# Patient Record
Sex: Female | Born: 1960 | Race: White | Hispanic: Yes | Marital: Single | Smoking: Never smoker
Health system: Southern US, Community
[De-identification: ages and names within clinical notes are randomized; demographics above are authoritative.]

## PROBLEM LIST (undated history)

## (undated) DIAGNOSIS — A879 Viral meningitis, unspecified: Secondary | ICD-10-CM

## (undated) DIAGNOSIS — Z9889 Other specified postprocedural states: Secondary | ICD-10-CM

## (undated) DIAGNOSIS — F329 Major depressive disorder, single episode, unspecified: Secondary | ICD-10-CM

## (undated) DIAGNOSIS — Z8659 Personal history of other mental and behavioral disorders: Secondary | ICD-10-CM

## (undated) DIAGNOSIS — R112 Nausea with vomiting, unspecified: Secondary | ICD-10-CM

## (undated) DIAGNOSIS — M1990 Unspecified osteoarthritis, unspecified site: Secondary | ICD-10-CM

## (undated) DIAGNOSIS — K219 Gastro-esophageal reflux disease without esophagitis: Secondary | ICD-10-CM

## (undated) DIAGNOSIS — G971 Other reaction to spinal and lumbar puncture: Secondary | ICD-10-CM

## (undated) DIAGNOSIS — G4733 Obstructive sleep apnea (adult) (pediatric): Secondary | ICD-10-CM

## (undated) DIAGNOSIS — I10 Essential (primary) hypertension: Secondary | ICD-10-CM

## (undated) DIAGNOSIS — Z9989 Dependence on other enabling machines and devices: Secondary | ICD-10-CM

## (undated) DIAGNOSIS — H269 Unspecified cataract: Secondary | ICD-10-CM

## (undated) DIAGNOSIS — C4490 Unspecified malignant neoplasm of skin, unspecified: Secondary | ICD-10-CM

## (undated) DIAGNOSIS — M353 Polymyalgia rheumatica: Secondary | ICD-10-CM

## (undated) DIAGNOSIS — F32A Depression, unspecified: Secondary | ICD-10-CM

## (undated) DIAGNOSIS — I82409 Acute embolism and thrombosis of unspecified deep veins of unspecified lower extremity: Secondary | ICD-10-CM

## (undated) DIAGNOSIS — E236 Other disorders of pituitary gland: Secondary | ICD-10-CM

## (undated) HISTORY — DX: Essential (primary) hypertension: I10

## (undated) HISTORY — PX: COLONOSCOPY: SHX174

## (undated) HISTORY — DX: Acute embolism and thrombosis of unspecified deep veins of unspecified lower extremity: I82.409

## (undated) HISTORY — DX: Major depressive disorder, single episode, unspecified: F32.9

## (undated) HISTORY — DX: Personal history of other mental and behavioral disorders: Z86.59

## (undated) HISTORY — DX: Unspecified malignant neoplasm of skin, unspecified: C44.90

## (undated) HISTORY — DX: Depression, unspecified: F32.A

## (undated) HISTORY — DX: Viral meningitis, unspecified: A87.9

## (undated) HISTORY — DX: Unspecified osteoarthritis, unspecified site: M19.90

## (undated) HISTORY — DX: Dependence on other enabling machines and devices: Z99.89

## (undated) HISTORY — DX: Morbid (severe) obesity due to excess calories: E66.01

## (undated) HISTORY — DX: Obstructive sleep apnea (adult) (pediatric): G47.33

---

## 1986-03-09 DIAGNOSIS — Z8659 Personal history of other mental and behavioral disorders: Secondary | ICD-10-CM

## 1986-03-09 HISTORY — DX: Personal history of other mental and behavioral disorders: Z86.59

## 1995-03-10 DIAGNOSIS — A879 Viral meningitis, unspecified: Secondary | ICD-10-CM

## 1995-03-10 HISTORY — DX: Viral meningitis, unspecified: A87.9

## 2001-03-09 HISTORY — PX: LAPAROSCOPIC CHOLECYSTECTOMY: SUR755

## 2004-03-09 DIAGNOSIS — I82409 Acute embolism and thrombosis of unspecified deep veins of unspecified lower extremity: Secondary | ICD-10-CM

## 2004-03-09 HISTORY — DX: Acute embolism and thrombosis of unspecified deep veins of unspecified lower extremity: I82.409

## 2004-05-02 ENCOUNTER — Ambulatory Visit: Payer: Self-pay | Admitting: Cardiology

## 2005-03-09 HISTORY — PX: BUNIONECTOMY: SHX129

## 2005-03-09 HISTORY — PX: MYOMECTOMY: SHX85

## 2015-07-22 ENCOUNTER — Encounter: Payer: Self-pay | Admitting: Neurology

## 2015-07-22 ENCOUNTER — Ambulatory Visit (INDEPENDENT_AMBULATORY_CARE_PROVIDER_SITE_OTHER): Payer: PRIVATE HEALTH INSURANCE | Admitting: Neurology

## 2015-07-22 VITALS — BP 132/78 | HR 68 | Ht 64.5 in | Wt 290.0 lb

## 2015-07-22 DIAGNOSIS — R269 Unspecified abnormalities of gait and mobility: Secondary | ICD-10-CM | POA: Diagnosis not present

## 2015-07-22 DIAGNOSIS — M2550 Pain in unspecified joint: Secondary | ICD-10-CM | POA: Diagnosis not present

## 2015-07-22 NOTE — Progress Notes (Signed)
Reason for visit: Gait disorder  Referring physician: Dr. Hennie Molina is a 55 y.o. female  History of present illness:  [name] is a 55 year old right-handed white female with a history of onset of a gait problem that began about 2 months ago. The patient indicated that the problem started with diffuse discomfort in the joints mainly affecting the hands, right elbow and shoulder, the feet and ankles. The patient was given a shot of Depo-Medrol which made the joint discomfort much better. The patient has had ongoing issues with the sensation of weakness in the legs, however. She has not had any falls, but she reports some difficulty getting up from a chair or climbing stairs. She has had to walk with her feet wider apart. She denies any significant neck or low back pain, but she does have some mid back pain. She has not had any difficulty controlling the bowels or the bladder with the exception that she did have some incontinence of the bladder last evening. The patient reports no fevers, chills, or night sweats. She has not had any skin rashes. She indicates that her primary doctor did check blood work for lupus and rheumatoid arthritis, the studies were unremarkable. She indicates that a sedimentation rate was done and was normal. I do not have the results of these tests. The patient indicates that in 2006 she underwent a MRI of the brain because of some vision changes affecting the right eye. The MRI was reviewed on this visit, this shows minimal white matter changes. No contrast enhancement was seen. The patient was seen by Dr. Sanda Klein at that time. No diagnosis was rendered. She is sent to this office for an evaluation. MRI of the cervical spine done in July 2011 shows evidence of a disc protrusion at the C6-7 level off to the right that did not compress the spinal cord. The patient was having left arm pain at that time.  Past Medical History  Diagnosis Date  . Depression   . H/O  major depression 1988  . DVT (deep venous thrombosis) ([place]) 2006  . Viral meningitis 1997  . Osteoarthritis   . Hypertension   . Skin cancer   . Morbidly obese ([place])   . OSA on CPAP     Past Surgical History  Procedure Laterality Date  . Laparoscopic cholecystectomy  2003  . Bunionectomy  2007  . Myomectomy  2007    Family History  Problem Relation Age of Onset  . Hypertension Mother   . Osteoporosis Mother   . Dementia Paternal Grandmother   . Aortic aneurysm Paternal Grandfather   . Heart attack Maternal Grandfather     Social history:  reports that she has never smoked. She does not have any smokeless tobacco history on file. She reports that she drinks alcohol. She reports that she does not use illicit drugs.  Medications:  Prior to Admission medications   Medication Sig Start Date End Date Taking? Authorizing Provider  aspirin 81 MG tablet Take 81 mg by mouth daily.   Yes Historical Provider, MD  Calcium Carb-Cholecalciferol (CALCIUM 600 + D PO) Take 2 tablets by mouth daily.   Yes Historical Provider, MD  losartan (COZAAR) 100 MG tablet Take 100 mg by mouth daily.   Yes Historical Provider, MD  meloxicam (MOBIC) 15 MG tablet Take 15 mg by mouth daily.   Yes Historical Provider, MD  Multiple Vitamin (MULTIVITAMIN) tablet Take 1 tablet by mouth daily.   Yes  Historical Provider, MD  omeprazole (PRILOSEC) 20 MG capsule Take 20 mg by mouth daily.   Yes Historical Provider, MD     No Known Allergies  ROS:  Out of a complete 14 system review of symptoms, the patient complains only of the following symptoms, and all other reviewed systems are negative.  Hearing loss Itching Diarrhea Urinary incontinence Feeling hot, cold Joint pain Weakness Too much sleep, decreased energy Sleepiness  Blood pressure 132/78, pulse 68, height 5' 4.5" (1.638 m), weight 290 lb (131.543 kg).  Physical Exam  General: The patient is alert and cooperative at the time of the  examination. The patient is morbidly obese.  Eyes: Pupils are equal, round, and reactive to light. Discs are flat bilaterally.  Neck: The neck is supple, no carotid bruits are noted.  Respiratory: The respiratory examination is clear.  Cardiovascular: The cardiovascular examination reveals a regular rate and rhythm, no obvious murmurs or rubs are noted.  Skin: Extremities are without significant edema.  Neurologic Exam  Mental status: The patient is alert and oriented x 3 at the time of the examination. The patient has apparent normal recent and remote memory, with an apparently normal attention span and concentration ability.  Cranial nerves: Facial symmetry is present. There is good sensation of the face to pinprick and soft touch bilaterally. The strength of the facial muscles and the muscles to head turning and shoulder shrug are normal bilaterally. Speech is well enunciated, no aphasia or dysarthria is noted. Extraocular movements are full. Visual fields are full. The tongue is midline, and the patient has symmetric elevation of the soft palate. No obvious hearing deficits are noted.  Motor: The motor testing reveals 5 over 5 strength of all 4 extremities. Good symmetric motor tone is noted throughout.  Sensory: Sensory testing is intact to pinprick, soft touch, vibration sensation, and position sense on all 4 extremities. No evidence of extinction is noted.  Coordination: Cerebellar testing reveals good finger-nose-finger and heel-to-shin bilaterally.  Gait and station: Gait is associated with a wide-based gait. The patient is able to arise from a seated position with arms crossed. Tandem gait is unsteady. Romberg is negative. No drift is seen.  Reflexes: Deep tendon reflexes are symmetric and normal bilaterally. Toes are downgoing bilaterally.   Assessment/Plan:  1. Mild lower extremity weakness  2. Arthralgias  The patient indicates a rather rapid onset of joint pains and  some sensation of weakness in the legs. The patient has had some blood work showing a negative rheumatoid factor and an ANA. The patient will undergo further blood work today, and she will undergo nerve conduction studies of both legs, and EMG of both legs. Further evaluation may be necessary depending upon the results of the above.  Jill Alexanders MD 07/22/2015 6:55 PM  [facility] [address] [place], [place] [address]  Phone [phone] Fax [phone]

## 2015-07-23 LAB — ANGIOTENSIN CONVERTING ENZYME: ANGIO CONVERT ENZYME: 40 U/L (ref 14–82)

## 2015-07-23 LAB — B. BURGDORFI ANTIBODIES

## 2015-07-23 LAB — SJOGREN'S SYNDROME ANTIBODS(SSA + SSB)

## 2015-07-23 LAB — C-REACTIVE PROTEIN: CRP: 2.3 mg/L (ref 0.0–4.9)

## 2015-07-23 LAB — VITAMIN B12: VITAMIN B 12: 622 pg/mL (ref 211–946)

## 2015-07-23 LAB — CK: CK TOTAL: 199 U/L — AB (ref 24–173)

## 2015-07-25 ENCOUNTER — Telehealth: Payer: Self-pay | Admitting: *Deleted

## 2015-07-25 NOTE — Telephone Encounter (Signed)
------------------------------------------------------------   Laurie Molina                CID [id number]  Patient SAME                 Pt's Dr              Area Code 336 Phone# [phone] 6/21 12:45PM APPT          ALT# [id number]                                    Disp:Y/N N If Y = C/B If No Response In 29minutes ============================================================

## 2015-07-31 ENCOUNTER — Telehealth: Payer: Self-pay | Admitting: *Deleted

## 2015-07-31 NOTE — Telephone Encounter (Signed)
Pt has NCV with EMG scheduled 08/28/15.

## 2015-07-31 NOTE — Telephone Encounter (Signed)
Message For: [facility]                  Taken 24-MAY-17 at  1:17PM by RMS ------------------------------------------------------------ Laurie Molina                CID [id number]  Patient SAME**PLS CB 5/24**  Pt's Dr Jannifer Franklin       Area Code 336 Phone# [phone] ON 6/21-WAITING FOR         UPDATE TO SEE IF SHE CAN HAVE THEM DONE SEPARATE     Disp:Y/N N If Y = C/B If No Response In 12minutes ============================================================

## 2015-08-14 ENCOUNTER — Ambulatory Visit (INDEPENDENT_AMBULATORY_CARE_PROVIDER_SITE_OTHER): Payer: Self-pay | Admitting: Neurology

## 2015-08-14 ENCOUNTER — Encounter: Payer: Self-pay | Admitting: Neurology

## 2015-08-14 ENCOUNTER — Ambulatory Visit (INDEPENDENT_AMBULATORY_CARE_PROVIDER_SITE_OTHER): Payer: PRIVATE HEALTH INSURANCE | Admitting: Neurology

## 2015-08-14 DIAGNOSIS — R269 Unspecified abnormalities of gait and mobility: Secondary | ICD-10-CM

## 2015-08-14 DIAGNOSIS — M2550 Pain in unspecified joint: Secondary | ICD-10-CM

## 2015-08-14 DIAGNOSIS — R531 Weakness: Secondary | ICD-10-CM

## 2015-08-14 DIAGNOSIS — R2689 Other abnormalities of gait and mobility: Secondary | ICD-10-CM

## 2015-08-14 NOTE — Progress Notes (Signed)
Laurie Molina is a 55 year old patient with a history of rapidly progressive symptoms with poly-arthralgias, some sensation of weakness in the legs, difficulty walking. The patient returns for EMG nerve conduction studies today. The study has been relatively unremarkable, no evidence of a neuropathy or evidence of a myopathic disorder is seen. The patient has been seen by Dr. Trudie Reed and the possibility of a polymyalgia rheumatica syndrome has been considered even though sedimentation rates have been unremarkable.  The patient may be given a trial on steroids through Dr. Trudie Reed. If this is not effective, the patient may return for further evaluation through this office.

## 2015-08-14 NOTE — Procedures (Signed)
     HISTORY:  Laurie Molina is a 55 year old patient with a history of polyarthralgias, some sensation of lower extremity weakness. The patient has had blood work that has been relatively unremarkable with exception of minimal elevation in CK enzyme levels. She is being evaluated for the leg weakness.  NERVE CONDUCTION STUDIES:  Nerve conduction studies were performed on both lower extremities. The distal motor latencies and motor amplitudes for the peroneal and posterior tibial nerves were within normal limits. The nerve conduction velocities for these nerves were also normal. The H reflex latencies were normal. The sensory latencies for the peroneal nerves were within normal limits.   EMG STUDIES:  EMG study was performed on the right lower extremity:  The tibialis anterior muscle reveals 2 to 4K motor units with full recruitment. No fibrillations or positive waves were seen. The peroneus tertius muscle reveals 2 to 4K motor units with full recruitment. No fibrillations or positive waves were seen. The medial gastrocnemius muscle reveals 1 to 3K motor units with full recruitment. No fibrillations or positive waves were seen. The vastus lateralis muscle reveals 2 to 4K motor units with full recruitment. No fibrillations or positive waves were seen. The iliopsoas muscle reveals 2 to 4K motor units with full recruitment. No fibrillations or positive waves were seen. The biceps femoris muscle (long head) reveals 2 to 4K motor units with full recruitment. No fibrillations or positive waves were seen. The lumbosacral paraspinal muscles were tested at 3 levels, and revealed no abnormalities of insertional activity at all 3 levels tested. There was good relaxation.  EMG study was performed on the left lower extremity:  The tibialis anterior muscle reveals 2 to 4K motor units with full recruitment. No fibrillations or positive waves were seen. The peroneus tertius muscle reveals 2 to 4K motor  units with full recruitment. No fibrillations or positive waves were seen. The medial gastrocnemius muscle reveals 1 to 3K motor units with full recruitment. No fibrillations or positive waves were seen. The vastus lateralis muscle reveals 2 to 4K motor units with full recruitment. No fibrillations or positive waves were seen. The iliopsoas muscle reveals 2 to 4K motor units with full recruitment. No fibrillations or positive waves were seen. The biceps femoris muscle (long head) reveals 2 to 4K motor units with full recruitment. No fibrillations or positive waves were seen. The lumbosacral paraspinal muscles were tested at 3 levels, and revealed no abnormalities of insertional activity at all 3 levels tested. There was good relaxation.   IMPRESSION:  Nerve conduction studies performed on both lower extremities were within normal limits. No evidence of a peripheral neuropathy is seen. EMG evaluation of both lower extremities were unremarkable, without evidence of a myopathic or a neuropathic disorder. There is no evidence of a lumbosacral radiculopathy on either side.  Jill Alexanders MD 08/14/2015 4:07 PM  [facility] [address] [place], [place] [address]  Phone [phone] Fax [phone]

## 2015-08-14 NOTE — Progress Notes (Signed)
Please refer to EMG and nerve conduction study procedure note. 

## 2015-08-28 ENCOUNTER — Encounter: Payer: PPO | Admitting: Neurology

## 2015-12-16 ENCOUNTER — Other Ambulatory Visit: Payer: Self-pay | Admitting: Neurosurgery

## 2015-12-17 ENCOUNTER — Encounter (HOSPITAL_COMMUNITY): Payer: Self-pay | Admitting: *Deleted

## 2015-12-17 MED ORDER — DEXTROSE 5 % IV SOLN
3.0000 g | INTRAVENOUS | Status: DC
  Filled 2015-12-17: qty 3000

## 2015-12-17 NOTE — Progress Notes (Signed)
Pt denies SOB, chest pain, and being under the care of a cardiologist. Pt denies having a stress test, echo and cardiac cath. Pt denies having any recent labs but had a chest x ray and EKG at [facility]. records requested. Pt stated that she was advised by MD to stop taking Aspirin, last dose was yesterday. Pt made aware to stop taking vitamins, fish oil and herbal medications. Do not take any NSAIDs ie: Ibuprofen, Advil, Naproxen, BC and Goody Powder or any medication containing Aspirin. Pt verbalized understanding of all pre-op instructions.

## 2015-12-18 ENCOUNTER — Inpatient Hospital Stay (HOSPITAL_COMMUNITY)
Admission: EM | Admit: 2015-12-18 | Discharge: 2015-12-27 | DRG: 614 | Disposition: A | Discharge status: Home / Self Care | Payer: PRIVATE HEALTH INSURANCE | Attending: Neurosurgery | Admitting: Neurosurgery

## 2015-12-18 ENCOUNTER — Emergency Department (HOSPITAL_COMMUNITY): Payer: PRIVATE HEALTH INSURANCE

## 2015-12-18 ENCOUNTER — Inpatient Hospital Stay (HOSPITAL_COMMUNITY): Payer: PRIVATE HEALTH INSURANCE | Admitting: Anesthesiology

## 2015-12-18 ENCOUNTER — Inpatient Hospital Stay (HOSPITAL_COMMUNITY): Payer: PRIVATE HEALTH INSURANCE

## 2015-12-18 ENCOUNTER — Inpatient Hospital Stay (HOSPITAL_COMMUNITY): Admission: RE | Admit: 2015-12-18 | Payer: PRIVATE HEALTH INSURANCE | Source: Ambulatory Visit | Admitting: Neurosurgery

## 2015-12-18 ENCOUNTER — Encounter (HOSPITAL_COMMUNITY): Payer: Self-pay | Admitting: *Deleted

## 2015-12-18 ENCOUNTER — Encounter (HOSPITAL_COMMUNITY)
Admission: EM | Disposition: A | Discharge status: Home / Self Care | Payer: Self-pay | Source: Home / Self Care | Attending: Neurosurgery

## 2015-12-18 DIAGNOSIS — K219 Gastro-esophageal reflux disease without esophagitis: Secondary | ICD-10-CM | POA: Diagnosis present

## 2015-12-18 DIAGNOSIS — Z86718 Personal history of other venous thrombosis and embolism: Secondary | ICD-10-CM | POA: Diagnosis not present

## 2015-12-18 DIAGNOSIS — Z7982 Long term (current) use of aspirin: Secondary | ICD-10-CM | POA: Diagnosis not present

## 2015-12-18 DIAGNOSIS — H5347 Heteronymous bilateral field defects: Secondary | ICD-10-CM | POA: Diagnosis present

## 2015-12-18 DIAGNOSIS — I10 Essential (primary) hypertension: Secondary | ICD-10-CM | POA: Diagnosis present

## 2015-12-18 DIAGNOSIS — R519 Headache, unspecified: Secondary | ICD-10-CM

## 2015-12-18 DIAGNOSIS — D352 Benign neoplasm of pituitary gland: Principal | ICD-10-CM | POA: Diagnosis present

## 2015-12-18 DIAGNOSIS — Z7952 Long term (current) use of systemic steroids: Secondary | ICD-10-CM

## 2015-12-18 DIAGNOSIS — Z8249 Family history of ischemic heart disease and other diseases of the circulatory system: Secondary | ICD-10-CM

## 2015-12-18 DIAGNOSIS — Z85828 Personal history of other malignant neoplasm of skin: Secondary | ICD-10-CM

## 2015-12-18 DIAGNOSIS — Z79899 Other long term (current) drug therapy: Secondary | ICD-10-CM

## 2015-12-18 DIAGNOSIS — R51 Headache: Secondary | ICD-10-CM

## 2015-12-18 DIAGNOSIS — E236 Other disorders of pituitary gland: Secondary | ICD-10-CM | POA: Diagnosis present

## 2015-12-18 DIAGNOSIS — Z452 Encounter for adjustment and management of vascular access device: Secondary | ICD-10-CM

## 2015-12-18 DIAGNOSIS — R001 Bradycardia, unspecified: Secondary | ICD-10-CM | POA: Diagnosis present

## 2015-12-18 DIAGNOSIS — G4733 Obstructive sleep apnea (adult) (pediatric): Secondary | ICD-10-CM | POA: Diagnosis present

## 2015-12-18 DIAGNOSIS — Z6841 Body Mass Index (BMI) 40.0 and over, adult: Secondary | ICD-10-CM

## 2015-12-18 DIAGNOSIS — H814 Vertigo of central origin: Secondary | ICD-10-CM

## 2015-12-18 HISTORY — DX: Nausea with vomiting, unspecified: R11.2

## 2015-12-18 HISTORY — DX: Gastro-esophageal reflux disease without esophagitis: K21.9

## 2015-12-18 HISTORY — DX: Other specified postprocedural states: Z98.890

## 2015-12-18 HISTORY — DX: Other disorders of pituitary gland: E23.6

## 2015-12-18 HISTORY — DX: Unspecified cataract: H26.9

## 2015-12-18 HISTORY — DX: Other reaction to spinal and lumbar puncture: G97.1

## 2015-12-18 HISTORY — PX: SINUS ENDO W/FUSION: SHX777

## 2015-12-18 HISTORY — DX: Polymyalgia rheumatica: M35.3

## 2015-12-18 HISTORY — PX: CRANIOTOMY: SHX93

## 2015-12-18 LAB — BASIC METABOLIC PANEL
Anion gap: 10 (ref 5–15)
BUN: 25 mg/dL — ABNORMAL HIGH (ref 6–20)
CO2: 27 mmol/L (ref 22–32)
Calcium: 9.3 mg/dL (ref 8.9–10.3)
Chloride: 104 mmol/L (ref 101–111)
Creatinine, Ser: 0.76 mg/dL (ref 0.44–1.00)
GFR calc Af Amer: >60 mL/min (ref >60)
GFR calc non Af Amer: >60 mL/min (ref >60)
Glucose, Bld: 148 mg/dL — ABNORMAL HIGH (ref 65–99)
Potassium: 4.2 mmol/L (ref 3.5–5.1)
Sodium: 141 mmol/L (ref 135–145)

## 2015-12-18 LAB — TYPE AND SCREEN
ABO/RH(D): A POS
Antibody Screen: NEGATIVE

## 2015-12-18 LAB — PROTIME-INR
INR: 0.88
Prothrombin Time: 11.9 seconds (ref 11.4–15.2)

## 2015-12-18 LAB — CBC
HCT: 41 % (ref 36.0–46.0)
Hemoglobin: 13.5 g/dL (ref 12.0–15.0)
MCH: 30.8 pg (ref 26.0–34.0)
MCHC: 32.9 g/dL (ref 30.0–36.0)
MCV: 93.6 fL (ref 78.0–100.0)
Platelets: 153 10*3/uL (ref 150–400)
RBC: 4.38 MIL/uL (ref 3.87–5.11)
RDW: 14.4 % (ref 11.5–15.5)
WBC: 6.2 10*3/uL (ref 4.0–10.5)

## 2015-12-18 LAB — ABO/RH: ABO/RH(D): A POS

## 2015-12-18 SURGERY — CRANIOTOMY HYPOPHYSECTOMY TRANSNASAL APPROACH
Anesthesia: General | Site: Nose

## 2015-12-18 MED ORDER — ONDANSETRON HCL 4 MG PO TABS: 4.0000 mg | ORAL_TABLET | ORAL | Status: DC | PRN

## 2015-12-18 MED ORDER — HYDROMORPHONE HCL 1 MG/ML IJ SOLN
INTRAMUSCULAR | Status: AC
  Administered 2015-12-18: 0.5 mg via INTRAVENOUS
  Filled 2015-12-18: qty 1

## 2015-12-18 MED ORDER — LIDOCAINE-EPINEPHRINE 1 %-1:100000 IJ SOLN
INTRAMUSCULAR | Status: AC
  Filled 2015-12-18: qty 1

## 2015-12-18 MED ORDER — FLUOXETINE HCL 20 MG PO CAPS
40.0000 mg | ORAL_CAPSULE | ORAL | Status: DC
  Administered 2015-12-19 – 2015-12-27 (×5): 40 mg via ORAL
  Filled 2015-12-18 (×5): qty 2

## 2015-12-18 MED ORDER — POVIDONE-IODINE 10 % EX SOLN
CUTANEOUS | Status: DC | PRN
  Administered 2015-12-18: 1 via TOPICAL

## 2015-12-18 MED ORDER — POTASSIUM CHLORIDE IN NACL 20-0.9 MEQ/L-% IV SOLN: INTRAVENOUS | Status: DC

## 2015-12-18 MED ORDER — LABETALOL HCL 5 MG/ML IV SOLN: 10.0000 mg | INTRAVENOUS | Status: DC | PRN

## 2015-12-18 MED ORDER — FLUOXETINE HCL 20 MG PO CAPS
20.0000 mg | ORAL_CAPSULE | ORAL | Status: DC
  Administered 2015-12-20 – 2015-12-26 (×4): 20 mg via ORAL
  Filled 2015-12-18 (×4): qty 1

## 2015-12-18 MED ORDER — CHLORHEXIDINE GLUCONATE CLOTH 2 % EX PADS
6.0000 | MEDICATED_PAD | Freq: Once | CUTANEOUS | Status: DC
  Administered 2015-12-18: 6 via TOPICAL

## 2015-12-18 MED ORDER — SENNOSIDES-DOCUSATE SODIUM 8.6-50 MG PO TABS: 1.0000 | ORAL_TABLET | Freq: Every evening | ORAL | Status: DC | PRN

## 2015-12-18 MED ORDER — PREDNISONE 20 MG PO TABS
20.0000 mg | ORAL_TABLET | Freq: Two times a day (BID) | ORAL | Status: DC
  Administered 2015-12-18: 20 mg via ORAL
  Filled 2015-12-18: qty 1

## 2015-12-18 MED ORDER — ONDANSETRON HCL 4 MG PO TABS
4.0000 mg | ORAL_TABLET | ORAL | Status: DC | PRN
  Administered 2015-12-21 – 2015-12-24 (×11): 4 mg via ORAL
  Filled 2015-12-18 (×12): qty 1

## 2015-12-18 MED ORDER — OXYMETAZOLINE HCL 0.05 % NA SOLN
NASAL | Status: DC | PRN
  Administered 2015-12-18: 2 via NASAL

## 2015-12-18 MED ORDER — BACITRACIN ZINC 500 UNIT/GM EX OINT
TOPICAL_OINTMENT | CUTANEOUS | Status: AC
  Filled 2015-12-18: qty 28.35

## 2015-12-18 MED ORDER — MAGNESIUM CITRATE PO SOLN: 1.0000 | Freq: Once | ORAL | Status: DC | PRN

## 2015-12-18 MED ORDER — OXYCODONE HCL 5 MG PO TABS: 5.0000 mg | ORAL_TABLET | Freq: Once | ORAL | Status: DC | PRN

## 2015-12-18 MED ORDER — OXYMETAZOLINE HCL 0.05 % NA SOLN
NASAL | Status: AC
  Filled 2015-12-18: qty 75

## 2015-12-18 MED ORDER — CHLORHEXIDINE GLUCONATE CLOTH 2 % EX PADS: 6.0000 | MEDICATED_PAD | Freq: Once | CUTANEOUS | Status: DC

## 2015-12-18 MED ORDER — NALOXONE HCL 0.4 MG/ML IJ SOLN: 0.0800 mg | INTRAMUSCULAR | Status: DC | PRN

## 2015-12-18 MED ORDER — PANTOPRAZOLE SODIUM 40 MG PO TBEC
40.0000 mg | DELAYED_RELEASE_TABLET | Freq: Every day | ORAL | Status: DC
  Administered 2015-12-18: 40 mg via ORAL
  Filled 2015-12-18: qty 1

## 2015-12-18 MED ORDER — CEFAZOLIN IN D5W 1 GM/50ML IV SOLN: 1.0000 g | Freq: Three times a day (TID) | INTRAVENOUS | Status: DC

## 2015-12-18 MED ORDER — HYDROCORTISONE NA SUCCINATE PF 100 MG IJ SOLR
100.0000 mg | Freq: Once | INTRAMUSCULAR | Status: AC
  Administered 2015-12-18: .1 g via INTRAVENOUS
  Filled 2015-12-18: qty 2

## 2015-12-18 MED ORDER — HYDROCODONE-ACETAMINOPHEN 5-325 MG PO TABS
ORAL_TABLET | ORAL | Status: AC
  Filled 2015-12-18: qty 1

## 2015-12-18 MED ORDER — ONDANSETRON HCL 4 MG/2ML IJ SOLN: 4.0000 mg | Freq: Once | INTRAMUSCULAR | Status: DC | PRN

## 2015-12-18 MED ORDER — ARTIFICIAL TEARS OP OINT
TOPICAL_OINTMENT | OPHTHALMIC | Status: DC | PRN
  Administered 2015-12-18: 1 via OPHTHALMIC

## 2015-12-18 MED ORDER — PROMETHAZINE HCL 25 MG PO TABS: 12.5000 mg | ORAL_TABLET | ORAL | Status: DC | PRN

## 2015-12-18 MED ORDER — PROMETHAZINE HCL 25 MG PO TABS
12.5000 mg | ORAL_TABLET | ORAL | Status: DC | PRN
  Administered 2015-12-20: 25 mg via ORAL
  Administered 2015-12-20: 12.5 mg via ORAL
  Administered 2015-12-20 – 2015-12-21 (×2): 25 mg via ORAL
  Filled 2015-12-18 (×4): qty 1

## 2015-12-18 MED ORDER — BISACODYL 5 MG PO TBEC
5.0000 mg | DELAYED_RELEASE_TABLET | Freq: Every day | ORAL | Status: DC | PRN
  Administered 2015-12-20 – 2015-12-23 (×2): 5 mg via ORAL
  Filled 2015-12-18 (×2): qty 1

## 2015-12-18 MED ORDER — ROCURONIUM BROMIDE 100 MG/10ML IV SOLN
INTRAVENOUS | Status: DC | PRN
  Administered 2015-12-18: 30 mg via INTRAVENOUS
  Administered 2015-12-18: 40 mg via INTRAVENOUS
  Administered 2015-12-18: 20 mg via INTRAVENOUS
  Administered 2015-12-18: 60 mg via INTRAVENOUS

## 2015-12-18 MED ORDER — CEFAZOLIN SODIUM-DEXTROSE 2-4 GM/100ML-% IV SOLN
2.0000 g | Freq: Three times a day (TID) | INTRAVENOUS | Status: AC
Start: 2015-12-18 — End: 2015-12-19
  Administered 2015-12-18 – 2015-12-19 (×2): 2 g via INTRAVENOUS
  Filled 2015-12-18 (×2): qty 100

## 2015-12-18 MED ORDER — BOOST / RESOURCE BREEZE PO LIQD
1.0000 | Freq: Three times a day (TID) | ORAL | Status: DC
  Administered 2015-12-19 – 2015-12-22 (×8): 1 via ORAL
  Filled 2015-12-18 (×23): qty 1

## 2015-12-18 MED ORDER — ORAL CARE MOUTH RINSE
15.0000 mL | Freq: Two times a day (BID) | OROMUCOSAL | Status: DC
  Administered 2015-12-18 – 2015-12-27 (×7): 15 mL via OROMUCOSAL

## 2015-12-18 MED ORDER — BISACODYL 5 MG PO TBEC: 5.0000 mg | DELAYED_RELEASE_TABLET | Freq: Every day | ORAL | Status: DC | PRN

## 2015-12-18 MED ORDER — LIDOCAINE HCL (CARDIAC) 20 MG/ML IV SOLN
INTRAVENOUS | Status: DC | PRN
  Administered 2015-12-18: 100 mg via INTRATRACHEAL

## 2015-12-18 MED ORDER — FLUOXETINE HCL 20 MG PO CAPS: 20.0000 mg | ORAL_CAPSULE | ORAL | Status: DC

## 2015-12-18 MED ORDER — MORPHINE SULFATE (PF) 2 MG/ML IV SOLN
INTRAVENOUS | Status: AC
  Filled 2015-12-18: qty 1

## 2015-12-18 MED ORDER — FENTANYL CITRATE (PF) 100 MCG/2ML IJ SOLN
INTRAMUSCULAR | Status: AC
  Filled 2015-12-18: qty 4

## 2015-12-18 MED ORDER — MICROFIBRILLAR COLL HEMOSTAT EX POWD
CUTANEOUS | Status: AC
  Filled 2015-12-18: qty 5

## 2015-12-18 MED ORDER — MORPHINE SULFATE (PF) 2 MG/ML IV SOLN: 1.0000 mg | INTRAVENOUS | Status: DC | PRN

## 2015-12-18 MED ORDER — ONDANSETRON HCL 4 MG/2ML IJ SOLN: 4.0000 mg | INTRAMUSCULAR | Status: DC | PRN

## 2015-12-18 MED ORDER — PROPOFOL 10 MG/ML IV BOLUS
INTRAVENOUS | Status: DC | PRN
  Administered 2015-12-18: 130 mg via INTRAVENOUS

## 2015-12-18 MED ORDER — ONDANSETRON HCL 4 MG/2ML IJ SOLN
4.0000 mg | INTRAMUSCULAR | Status: DC | PRN
  Administered 2015-12-19 – 2015-12-21 (×10): 4 mg via INTRAVENOUS
  Filled 2015-12-18 (×10): qty 2

## 2015-12-18 MED ORDER — ONDANSETRON HCL 4 MG/2ML IJ SOLN
INTRAMUSCULAR | Status: DC | PRN
  Administered 2015-12-18: 4 mg via INTRAVENOUS

## 2015-12-18 MED ORDER — LOSARTAN POTASSIUM 50 MG PO TABS
100.0000 mg | ORAL_TABLET | Freq: Every day | ORAL | Status: DC
  Administered 2015-12-18: 100 mg via ORAL
  Filled 2015-12-18: qty 2

## 2015-12-18 MED ORDER — DOCUSATE SODIUM 100 MG PO CAPS: 100.0000 mg | ORAL_CAPSULE | Freq: Two times a day (BID) | ORAL | Status: DC

## 2015-12-18 MED ORDER — HYDROMORPHONE HCL 1 MG/ML IJ SOLN
0.2500 mg | INTRAMUSCULAR | Status: DC | PRN
  Administered 2015-12-18 (×4): 0.5 mg via INTRAVENOUS

## 2015-12-18 MED ORDER — HEMOSTATIC AGENTS (NO CHARGE) OPTIME
TOPICAL | Status: DC | PRN
  Administered 2015-12-18: 1 via TOPICAL

## 2015-12-18 MED ORDER — FAMOTIDINE IN NACL 20-0.9 MG/50ML-% IV SOLN
20.0000 mg | Freq: Two times a day (BID) | INTRAVENOUS | Status: DC
  Administered 2015-12-18 – 2015-12-21 (×5): 20 mg via INTRAVENOUS
  Filled 2015-12-18 (×6): qty 50

## 2015-12-18 MED ORDER — THROMBIN 5000 UNITS EX SOLR
CUTANEOUS | Status: DC | PRN
  Administered 2015-12-18 (×2): 5000 [IU] via TOPICAL

## 2015-12-18 MED ORDER — MORPHINE SULFATE (PF) 2 MG/ML IV SOLN
1.0000 mg | INTRAVENOUS | Status: DC | PRN
  Administered 2015-12-18 – 2015-12-24 (×9): 2 mg via INTRAVENOUS
  Filled 2015-12-18 (×8): qty 1

## 2015-12-18 MED ORDER — ASPIRIN EC 81 MG PO TBEC
81.0000 mg | DELAYED_RELEASE_TABLET | Freq: Every day | ORAL | Status: DC
  Administered 2015-12-19 – 2015-12-27 (×9): 81 mg via ORAL
  Filled 2015-12-18 (×9): qty 1

## 2015-12-18 MED ORDER — LIDOCAINE-EPINEPHRINE 1 %-1:100000 IJ SOLN
INTRAMUSCULAR | Status: DC | PRN
  Administered 2015-12-18: 7 mL

## 2015-12-18 MED ORDER — MORPHINE SULFATE (PF) 4 MG/ML IV SOLN
4.0000 mg | Freq: Once | INTRAVENOUS | Status: AC
  Administered 2015-12-18: 4 mg via INTRAVENOUS
  Filled 2015-12-18: qty 1

## 2015-12-18 MED ORDER — HEPARIN SODIUM (PORCINE) 5000 UNIT/ML IJ SOLN
5000.0000 [IU] | Freq: Three times a day (TID) | INTRAMUSCULAR | Status: DC
  Administered 2015-12-19 – 2015-12-27 (×25): 5000 [IU] via SUBCUTANEOUS
  Filled 2015-12-18 (×25): qty 1

## 2015-12-18 MED ORDER — MIDAZOLAM HCL 5 MG/5ML IJ SOLN
INTRAMUSCULAR | Status: DC | PRN
  Administered 2015-12-18: 2 mg via INTRAVENOUS

## 2015-12-18 MED ORDER — SUGAMMADEX SODIUM 200 MG/2ML IV SOLN
INTRAVENOUS | Status: AC
  Filled 2015-12-18: qty 2

## 2015-12-18 MED ORDER — LOSARTAN POTASSIUM 50 MG PO TABS
100.0000 mg | ORAL_TABLET | Freq: Every day | ORAL | Status: DC
  Administered 2015-12-19 – 2015-12-27 (×9): 100 mg via ORAL
  Filled 2015-12-18 (×9): qty 2

## 2015-12-18 MED ORDER — 0.9 % SODIUM CHLORIDE (POUR BTL) OPTIME
TOPICAL | Status: DC | PRN
  Administered 2015-12-18: 1000 mL

## 2015-12-18 MED ORDER — ONDANSETRON HCL 4 MG/2ML IJ SOLN
4.0000 mg | Freq: Once | INTRAMUSCULAR | Status: AC
  Administered 2015-12-18: 4 mg via INTRAVENOUS
  Filled 2015-12-18: qty 2

## 2015-12-18 MED ORDER — OXYCODONE HCL 5 MG/5ML PO SOLN: 5.0000 mg | Freq: Once | ORAL | Status: DC | PRN

## 2015-12-18 MED ORDER — HYDROCORTISONE NA SUCCINATE PF 100 MG IJ SOLR
50.0000 mg | Freq: Three times a day (TID) | INTRAMUSCULAR | Status: AC
  Administered 2015-12-18 – 2015-12-19 (×3): 50 mg via INTRAVENOUS
  Filled 2015-12-18 (×3): qty 2

## 2015-12-18 MED ORDER — DOCUSATE SODIUM 100 MG PO CAPS
100.0000 mg | ORAL_CAPSULE | Freq: Two times a day (BID) | ORAL | Status: DC
  Administered 2015-12-19 – 2015-12-27 (×17): 100 mg via ORAL
  Filled 2015-12-18 (×17): qty 1

## 2015-12-18 MED ORDER — SODIUM CHLORIDE 0.9 % IR SOLN
Status: DC | PRN
  Administered 2015-12-18 (×2): 1000 mL

## 2015-12-18 MED ORDER — HYDROCODONE-ACETAMINOPHEN 5-325 MG PO TABS
1.0000 | ORAL_TABLET | ORAL | Status: DC | PRN
  Administered 2015-12-18 – 2015-12-26 (×26): 1 via ORAL
  Filled 2015-12-18 (×26): qty 1

## 2015-12-18 MED ORDER — FENTANYL CITRATE (PF) 100 MCG/2ML IJ SOLN
INTRAMUSCULAR | Status: DC | PRN
  Administered 2015-12-18: 100 ug via INTRAVENOUS
  Administered 2015-12-18 (×2): 50 ug via INTRAVENOUS
  Administered 2015-12-18 (×2): 100 ug via INTRAVENOUS

## 2015-12-18 MED ORDER — IOPAMIDOL (ISOVUE-300) INJECTION 61%
INTRAVENOUS | Status: AC
  Administered 2015-12-18: 75 mL
  Filled 2015-12-18: qty 75

## 2015-12-18 MED ORDER — SODIUM CHLORIDE 0.9 % IV SOLN
INTRAVENOUS | Status: DC | PRN
  Administered 2015-12-18 (×2): via INTRAVENOUS

## 2015-12-18 MED ORDER — ONDANSETRON HCL 4 MG/2ML IJ SOLN
INTRAMUSCULAR | Status: AC
  Filled 2015-12-18: qty 2

## 2015-12-18 MED ORDER — CALCIUM CARBONATE-VITAMIN D 500-200 MG-UNIT PO TABS
2.0000 | ORAL_TABLET | Freq: Every morning | ORAL | Status: DC
  Administered 2015-12-19 – 2015-12-27 (×9): 2 via ORAL
  Filled 2015-12-18 (×5): qty 2
  Filled 2015-12-18: qty 1
  Filled 2015-12-18 (×3): qty 2

## 2015-12-18 MED ORDER — CEFAZOLIN SODIUM-DEXTROSE 2-3 GM-% IV SOLR
INTRAVENOUS | Status: DC | PRN
  Administered 2015-12-18: 2 g via INTRAVENOUS

## 2015-12-18 MED ORDER — LACTATED RINGERS IV SOLN
INTRAVENOUS | Status: DC
  Administered 2015-12-18: 13:00:00 via INTRAVENOUS

## 2015-12-18 MED ORDER — OXYMETAZOLINE HCL 0.05 % NA SOLN
NASAL | Status: DC | PRN
  Administered 2015-12-18: 1
  Administered 2015-12-18: 1 via TOPICAL
  Administered 2015-12-18: 1

## 2015-12-18 MED ORDER — PANTOPRAZOLE SODIUM 40 MG PO TBEC
40.0000 mg | DELAYED_RELEASE_TABLET | Freq: Every day | ORAL | Status: DC
  Administered 2015-12-19 – 2015-12-27 (×9): 40 mg via ORAL
  Filled 2015-12-18 (×9): qty 1

## 2015-12-18 MED ORDER — THROMBIN 5000 UNITS EX SOLR
CUTANEOUS | Status: AC
  Filled 2015-12-18: qty 10000

## 2015-12-18 MED ORDER — HYDROCODONE-ACETAMINOPHEN 5-325 MG PO TABS: 1.0000 | ORAL_TABLET | ORAL | Status: DC | PRN

## 2015-12-18 MED ORDER — ADULT MULTIVITAMIN W/MINERALS CH
1.0000 | ORAL_TABLET | Freq: Every day | ORAL | Status: DC
  Administered 2015-12-19 – 2015-12-27 (×8): 1 via ORAL
  Filled 2015-12-18 (×9): qty 1

## 2015-12-18 MED ORDER — POTASSIUM CHLORIDE IN NACL 20-0.9 MEQ/L-% IV SOLN
INTRAVENOUS | Status: DC
  Administered 2015-12-18 – 2015-12-22 (×6): via INTRAVENOUS
  Filled 2015-12-18 (×10): qty 1000

## 2015-12-18 MED ORDER — SUGAMMADEX SODIUM 200 MG/2ML IV SOLN
INTRAVENOUS | Status: DC | PRN
  Administered 2015-12-18: 50 mg via INTRAVENOUS
  Administered 2015-12-18: 200 mg via INTRAVENOUS

## 2015-12-18 MED ORDER — MIDAZOLAM HCL 2 MG/2ML IJ SOLN
INTRAMUSCULAR | Status: AC
  Filled 2015-12-18: qty 2

## 2015-12-18 MED ORDER — SODIUM CHLORIDE 0.9 % IV BOLUS (SEPSIS)
1000.0000 mL | Freq: Once | INTRAVENOUS | Status: AC
  Administered 2015-12-18: 1000 mL via INTRAVENOUS

## 2015-12-18 SURGICAL SUPPLY — 119 items
APL SKNCLS STERI-STRIP NONHPOA (GAUZE/BANDAGES/DRESSINGS)
ATTRACTOMAT 16X20 MAGNETIC DRP (DRAPES) IMPLANT
BALL CTTN LRG ABS STRL LF (GAUZE/BANDAGES/DRESSINGS)
BENZOIN TINCTURE PRP APPL 2/3 (GAUZE/BANDAGES/DRESSINGS) IMPLANT
BLADE EYE SICKLE 84 5 BEAV (BLADE) IMPLANT
BLADE ROTATE RAD 40 4 M4 (BLADE) ×1 IMPLANT
BLADE ROTATE TRICUT 4X13 M4 (BLADE) ×4 IMPLANT
BLADE SURG 11 STRL SS (BLADE) ×1 IMPLANT
BLADE SURG 15 STRL LF DISP TIS (BLADE) IMPLANT
BLADE SURG 15 STRL SS (BLADE) ×3
BUR MATCHSTICK NEURO 3.0 LAGG (BURR) ×1 IMPLANT
CANISTER SUCT 3000ML PPV (MISCELLANEOUS) ×3 IMPLANT
CANISTER SUCTION 2500CC (MISCELLANEOUS) ×6 IMPLANT
CATH ROBINSON RED A/P 14FR (CATHETERS) IMPLANT
COAGULATOR SUCT 8FR VV (MISCELLANEOUS) ×1 IMPLANT
COAGULATOR SUCT SWTCH 10FR 6 (ELECTROSURGICAL) ×1 IMPLANT
COTTONBALL LRG STERILE PKG (GAUZE/BANDAGES/DRESSINGS) IMPLANT
DECANTER SPIKE VIAL GLASS SM (MISCELLANEOUS) ×3 IMPLANT
DRAIN SUBARACHNOID (WOUND CARE) IMPLANT
DRAPE C-ARM 42X72 X-RAY (DRAPES) IMPLANT
DRAPE EENT ADH APERT 15X15 STR (DRAPES) IMPLANT
DRAPE MICROSCOPE LEICA (MISCELLANEOUS) IMPLANT
DRAPE OEC MINIVIEW 54X84 (DRAPES) ×1 IMPLANT
DRAPE POUCH INSTRU U-SHP 10X18 (DRAPES) ×3 IMPLANT
DRAPE PROXIMA HALF (DRAPES) IMPLANT
DRESSING NASAL KENNEDY 3.5X.9 (MISCELLANEOUS) IMPLANT
DRSG NASAL KENNEDY 3.5X.9 (MISCELLANEOUS)
DRSG NASOPORE 8CM (GAUZE/BANDAGES/DRESSINGS) ×1 IMPLANT
DURAPREP 26ML APPLICATOR (WOUND CARE) ×4 IMPLANT
ELECT COATED BLADE 2.86 ST (ELECTRODE) ×3 IMPLANT
ELECT NDL TIP 2.8 STRL (NEEDLE) IMPLANT
ELECT NEEDLE TIP 2.8 STRL (NEEDLE) IMPLANT
ELECT REM PT RETURN 9FT ADLT (ELECTROSURGICAL) ×6
ELECTRODE REM PT RTRN 9FT ADLT (ELECTROSURGICAL) ×4 IMPLANT
FILTER ARTHROSCOPY CONVERTOR (FILTER) IMPLANT
FLUID NSS /IRRIG 1000 ML XXX (MISCELLANEOUS) ×3 IMPLANT
GAUZE PACKING FOLDED 2  STR (GAUZE/BANDAGES/DRESSINGS)
GAUZE PACKING FOLDED 2 STR (GAUZE/BANDAGES/DRESSINGS) IMPLANT
GAUZE SPONGE 4X4 12PLY STRL (GAUZE/BANDAGES/DRESSINGS) ×2 IMPLANT
GAUZE SPONGE 4X4 16PLY XRAY LF (GAUZE/BANDAGES/DRESSINGS) ×1 IMPLANT
GLOVE BIOGEL M 7.0 STRL (GLOVE) ×7 IMPLANT
GLOVE BIOGEL PI IND STRL 7.5 (GLOVE) IMPLANT
GLOVE BIOGEL PI INDICATOR 7.5 (GLOVE) ×1
GLOVE ECLIPSE 6.5 STRL STRAW (GLOVE) ×4 IMPLANT
GLOVE EXAM NITRILE LRG STRL (GLOVE) IMPLANT
GLOVE EXAM NITRILE XL STR (GLOVE) IMPLANT
GLOVE EXAM NITRILE XS STR PU (GLOVE) IMPLANT
GLOVE OPTIFIT SS 7.5 STRL LX (GLOVE) IMPLANT
GOWN STRL REUS W/ TWL LRG LVL3 (GOWN DISPOSABLE) ×6 IMPLANT
GOWN STRL REUS W/ TWL XL LVL3 (GOWN DISPOSABLE) IMPLANT
GOWN STRL REUS W/TWL 2XL LVL3 (GOWN DISPOSABLE) IMPLANT
GOWN STRL REUS W/TWL LRG LVL3 (GOWN DISPOSABLE) ×9
GOWN STRL REUS W/TWL XL LVL3 (GOWN DISPOSABLE)
HEMOSTAT SURGICEL 2X14 (HEMOSTASIS) IMPLANT
KIT BASIN OR (CUSTOM PROCEDURE TRAY) ×6 IMPLANT
KIT ROOM TURNOVER OR (KITS) ×6 IMPLANT
KNIFE ARACHNOID DISP AM-24-SB (BLADE) ×1 IMPLANT
NDL 18GX1X1/2 (RX/OR ONLY) (NEEDLE) IMPLANT
NDL HYPO 25GX1X1/2 BEV (NEEDLE) ×2 IMPLANT
NDL HYPO 25X1 1.5 SAFETY (NEEDLE) ×2 IMPLANT
NDL PRECISIONGLIDE 27X1.5 (NEEDLE) ×2 IMPLANT
NDL SPNL 22GX3.5 QUINCKE BK (NEEDLE) ×2 IMPLANT
NEEDLE 18GX1X1/2 (RX/OR ONLY) (NEEDLE) IMPLANT
NEEDLE HYPO 25GX1X1/2 BEV (NEEDLE) ×6 IMPLANT
NEEDLE HYPO 25X1 1.5 SAFETY (NEEDLE) ×3 IMPLANT
NEEDLE PRECISIONGLIDE 27X1.5 (NEEDLE) ×3 IMPLANT
NEEDLE SPNL 22GX3.5 QUINCKE BK (NEEDLE) ×3 IMPLANT
NS IRRIG 1000ML POUR BTL (IV SOLUTION) ×7 IMPLANT
PACK LAMINECTOMY NEURO (CUSTOM PROCEDURE TRAY) ×3 IMPLANT
PAD ARMBOARD 7.5X6 YLW CONV (MISCELLANEOUS) ×9 IMPLANT
PATTIES SURGICAL .25X.25 (GAUZE/BANDAGES/DRESSINGS) IMPLANT
PATTIES SURGICAL .5 X3 (DISPOSABLE) ×4 IMPLANT
PENCIL BUTTON HOLSTER BLD 10FT (ELECTRODE) IMPLANT
RUBBERBAND STERILE (MISCELLANEOUS) IMPLANT
SHEATH ENDOSCRUB 0 DEG (SHEATH) ×1 IMPLANT
SHEATH ENDOSCRUB 30 DEG (SHEATH) ×1 IMPLANT
SHEATH ENDOSCRUB 45 DEG (SHEATH) ×1 IMPLANT
SPECIMEN JAR SMALL (MISCELLANEOUS) ×3 IMPLANT
SPLINT NASAL DOYLE BI-VL (GAUZE/BANDAGES/DRESSINGS) ×6 IMPLANT
SPONGE LAP 4X18 X RAY DECT (DISPOSABLE) ×3 IMPLANT
SPONGE NEURO XRAY DETECT 1X3 (DISPOSABLE) ×5 IMPLANT
SPONGE SURGIFOAM ABS GEL SZ50 (HEMOSTASIS) ×1 IMPLANT
STAPLER SKIN PROX WIDE 3.9 (STAPLE) ×1 IMPLANT
STRIP CLOSURE SKIN 1/2X4 (GAUZE/BANDAGES/DRESSINGS) IMPLANT
STRIP CLOSURE SKIN 1/4X4 (GAUZE/BANDAGES/DRESSINGS) IMPLANT
SUT CHROMIC 3 0 PS 2 (SUTURE) IMPLANT
SUT CHROMIC 4 0 P 3 18 (SUTURE) IMPLANT
SUT ETHILON 3 0 FSL (SUTURE) IMPLANT
SUT ETHILON 3 0 PS 1 (SUTURE) IMPLANT
SUT ETHILON 4 0 PS 2 18 (SUTURE) IMPLANT
SUT ETHILON 6 0 P 1 (SUTURE) IMPLANT
SUT NOVAFIL 6 0 PRE 2 4412 13 (SUTURE) IMPLANT
SUT PLAIN 4 0 ~~LOC~~ 1 (SUTURE) IMPLANT
SUT PROLENE 6 0 BV (SUTURE) IMPLANT
SUT SILK 2 0 FS (SUTURE) IMPLANT
SUT VIC AB 2-0 CT1 27 (SUTURE)
SUT VIC AB 2-0 CT1 27XBRD (SUTURE) IMPLANT
SUT VIC AB 2-0 CT2 18 VCP726D (SUTURE) IMPLANT
SUT VIC AB 3-0 SH 8-18 (SUTURE) IMPLANT
SUT VIC AB 4-0 P-3 18X BRD (SUTURE) IMPLANT
SUT VIC AB 4-0 P3 18 (SUTURE)
SWAB COLLECTION DEVICE MRSA (MISCELLANEOUS) IMPLANT
SYR 5ML LL (SYRINGE) IMPLANT
SYR CONTROL 10ML LL (SYRINGE) IMPLANT
TIP NONSTICK .5MMX23CM (INSTRUMENTS) ×3
TIP NONSTICK .5X23 (INSTRUMENTS) IMPLANT
TOWEL OR 17X24 6PK STRL BLUE (TOWEL DISPOSABLE) ×3 IMPLANT
TOWEL OR 17X26 10 PK STRL BLUE (TOWEL DISPOSABLE) ×3 IMPLANT
TRACKER ENT INSTRUMENT (MISCELLANEOUS) ×3 IMPLANT
TRACKER ENT PATIENT (MISCELLANEOUS) ×4 IMPLANT
TRAY ENT MC OR (CUSTOM PROCEDURE TRAY) ×6 IMPLANT
TRAY FOLEY W/METER SILVER 16FR (SET/KITS/TRAYS/PACK) ×3 IMPLANT
TUBE ANAEROBIC SPECIMEN COL (MISCELLANEOUS) IMPLANT
TUBE CONNECTING 12X1/4 (SUCTIONS) ×4 IMPLANT
TUBING EXTENTION W/L.L. (IV SETS) ×3 IMPLANT
TUBING STRAIGHTSHOT EPS 5PK (TUBING) ×4 IMPLANT
UNDERPAD 30X30 (UNDERPADS AND DIAPERS) ×3 IMPLANT
WATER STERILE IRR 1000ML POUR (IV SOLUTION) ×5 IMPLANT
WIPE INSTRUMENT VISIWIPE 73X73 (MISCELLANEOUS) ×3 IMPLANT

## 2015-12-18 NOTE — Op Note (Signed)
12/18/2015  5:14 PM  PATIENT:  Laurie Molina  55 y.o. female with visual field compromise, and a large pituitary mass.   PRE-OPERATIVE DIAGNOSIS:  PITUITARY MASS  POST-OPERATIVE DIAGNOSIS:  PITUITARY MASS  PROCEDURE:  Procedure(s): CRANIOTOMY HYPOPHYSECTOMY TRANSNASAL APPROACH ENDOSCOPIC SINUS SURGERY WITH NAVIGATION  SURGEON: Surgeon(s): Ashok Pall, MD Jerrell Belfast, MD  ASSISTANTS:shoemaker, md  ANESTHESIA:   general  EBL:  Total I/O In: 41 [I.V.:2500; IV Piggyback:1000] Out: 1600 [Urine:1400; Blood:200]  BLOOD ADMINISTERED:none  CELL SAVER GIVEN:none  COUNT:per nursing  DRAINS: none   SPECIMEN:  Source of Specimen:  pituitary gland  DICTATION: [name] was taken to the operating room, intubated, and placed under a general anesthetic without difficulty. She was positioned supine with her head on a headrest. Her abdomen  was prepped and draped in a sterile manner. Dr.Shoemaker will dictate the approach and closure under separate cover.  Once the sphenoid sinus was exposed, I used a drill to open a small opening in the sella, which I expanded with a Kerrison punch. I opened the gland and worked with  [employer] round Engineer, materials. I removed more bone on the right side and entered what appeared to be the hypodense region seen on the mri. I, did remove more tissue, but at the end what appeared to be a membrane was pulsating into the dural opening. There was a thick yellowish tissue overlying the "membrane", I did not pull on the tissue since It was located at the junction of the planum sphenoidale and the sella. We did use medtronic navigation during the case.  Once I saw the membrane pulsing I felt that I had decompressed the chiasm. Dr. Wilburn Cornelia then closed. Dr.Shoemaker operated the camera while I dealt with the pituitary mass. I did send what was greyish tissue to pathology.   PLAN OF CARE: Admit to inpatient   PATIENT DISPOSITION:  PACU - hemodynamically stable.    Delay start of Pharmacological VTE agent (>24hrs) due to surgical blood loss or risk of bleeding:  no

## 2015-12-18 NOTE — Anesthesia Postprocedure Evaluation (Signed)
Anesthesia Post Note  Patient: Laurie Molina  Procedure(s) Performed: Procedure(s) (LRB): CRANIOTOMY HYPOPHYSECTOMY TRANSNASAL APPROACH (N/A) ENDOSCOPIC SINUS SURGERY WITH NAVIGATION (N/A)  Patient location during evaluation: NICU Anesthesia Type: General Level of consciousness: awake, awake and alert and oriented Pain management: pain level controlled Vital Signs Assessment: post-procedure vital signs reviewed and stable Respiratory status: spontaneous breathing, nonlabored ventilation and respiratory function stable Cardiovascular status: blood pressure returned to baseline Anesthetic complications: no    Last Vitals:  Vitals:   12/18/15 1815 12/18/15 1822  BP:  128/73  Pulse: 64 67  Resp: 11 14  Temp:  36.6 C    Last Pain:  Vitals:   12/18/15 1848  TempSrc:   PainSc: 10-Worst pain ever                 Aqsa Sensabaugh COKER

## 2015-12-18 NOTE — ED Notes (Signed)
Pt ambulated to restroom with no difficulty.

## 2015-12-18 NOTE — Anesthesia Preprocedure Evaluation (Addendum)
Anesthesia Evaluation  Patient identified by MRN, date of birth, ID band Patient awake    Reviewed: Allergy & Precautions, NPO status , Patient's Chart, lab work & pertinent test results  Airway Mallampati: II  TM Distance: >3 FB Neck ROM: Full    Dental  (+) Teeth Intact, Dental Advisory Given   Pulmonary    breath sounds clear to auscultation       Cardiovascular hypertension,  Rhythm:Regular Rate:Normal     Neuro/Psych    GI/Hepatic   Endo/Other    Renal/GU      Musculoskeletal   Abdominal   Peds  Hematology   Anesthesia Other Findings   Reproductive/Obstetrics                            Anesthesia Physical Anesthesia Plan  ASA: III  Anesthesia Plan: General   Post-op Pain Management:    Induction: Intravenous  Airway Management Planned: Oral ETT  Additional Equipment: Arterial line and CVP  Intra-op Plan:   Post-operative Plan: Extubation in OR  Informed Consent: I have reviewed the patients History and Physical, chart, labs and discussed the procedure including the risks, benefits and alternatives for the proposed anesthesia with the patient or authorized representative who has indicated his/her understanding and acceptance.     Plan Discussed with: CRNA and Anesthesiologist  Anesthesia Plan Comments:         Anesthesia Quick Evaluation

## 2015-12-18 NOTE — Brief Op Note (Signed)
12/18/2015  5:33 PM  PATIENT:  Laurie Molina  55 y.o. female  PRE-OPERATIVE DIAGNOSIS:  PITUITARY MASS  POST-OPERATIVE DIAGNOSIS:  PITUITARY MASS  PROCEDURE:  Procedure(s): CRANIOTOMY HYPOPHYSECTOMY TRANSNASAL APPROACH (N/A) ENDOSCOPIC SINUS SURGERY WITH NAVIGATION (N/A)  SURGEON:  Surgeon(s) and Role: Panel 1:    * Ashok Pall, MD - Primary  Panel 2:    * Jerrell Belfast, MD - Primary  PHYSICIAN ASSISTANT:   ASSISTANTS: none   ANESTHESIA:   general  EBL:  Total I/O In: 3500 [I.V.:2500; IV Piggyback:1000] Out: 1600 [Urine:1400; Blood:200]  BLOOD ADMINISTERED:none  DRAINS: none   LOCAL MEDICATIONS USED:  LIDOCAINE  and Amount: 6 ml  SPECIMEN:  Source of Specimen:  pituitary mass  DISPOSITION OF SPECIMEN:  PATHOLOGY  COUNTS:  YES  TOURNIQUET:  * No tourniquets in log *  DICTATION: .Other Dictation: Dictation Number [id number]  PLAN OF CARE: Admit to inpatient   PATIENT DISPOSITION:  PACU - hemodynamically stable.   Delay start of Pharmacological VTE agent (>24hrs) due to surgical blood loss or risk of bleeding: yes

## 2015-12-18 NOTE — H&P (Signed)
Laurie Molina is an 55 y.o. female.   Chief Complaint: headache, pituitary mass HPI: Patient known to me since I saw her on this past Friday for her pituitary mass. She was scheduled for the OR today, when early this AM she experienced a severe headache not relieved with her usual regimen. She came to the ED, had a head ct which was unchanged.   Past Medical History:  Diagnosis Date  . Depression   . DVT (deep venous thrombosis) ([place]) 2006  . Early cataract    left  . GERD (gastroesophageal reflux disease)   . H/O major depression 1988  . Hypertension   . Morbidly obese ([place])   . OSA on CPAP   . Osteoarthritis   . Pituitary mass ([place])   . PMR (polymyalgia rheumatica) ([facility])   . PONV (postoperative nausea and vomiting)   . Skin cancer   . Spinal headache    from spinal tap had blood patch  . Viral meningitis 1997    Past Surgical History:  Procedure Laterality Date  . BUNIONECTOMY  2007  . COLONOSCOPY    . LAPAROSCOPIC CHOLECYSTECTOMY  2003  . MYOMECTOMY  2007    Family History  Problem Relation Age of Onset  . Hypertension Mother   . Osteoporosis Mother   . Dementia Paternal Grandmother   . Aortic aneurysm Paternal Grandfather   . Heart attack Maternal Grandfather    Social History:  reports that she has never smoked. She has never used smokeless tobacco. She reports that she drinks alcohol. She reports that she does not use drugs.  Allergies:  Allergies  Allergen Reactions  . No Known Allergies      (Not in a hospital admission)  Results for orders placed or performed during the hospital encounter of 12/18/15 (from the past 48 hour(s))  CBC     Status: None   Collection Time: 12/18/15  6:23 AM  Result Value Ref Range   WBC 6.2 4.0 - 10.5 K/uL   RBC 4.38 3.87 - 5.11 MIL/uL   Hemoglobin 13.5 12.0 - 15.0 g/dL   HCT 41.0 36.0 - 46.0 %   MCV 93.6 78.0 - 100.0 fL   MCH 30.8 26.0 - 34.0 pg   MCHC 32.9 30.0 - 36.0 g/dL   RDW 14.4 11.5 - 15.5 %   Platelets 153 150  - 400 K/uL  Basic metabolic panel     Status: Abnormal   Collection Time: 12/18/15  6:23 AM  Result Value Ref Range   Sodium 141 135 - 145 mmol/L   Potassium 4.2 3.5 - 5.1 mmol/L   Chloride 104 101 - 111 mmol/L   CO2 27 22 - 32 mmol/L   Glucose, Bld 148 (H) 65 - 99 mg/dL   BUN 25 (H) 6 - 20 mg/dL   Creatinine, Ser 0.76 0.44 - 1.00 mg/dL   Calcium 9.3 8.9 - 10.3 mg/dL   GFR calc non Af Amer >60 >60 mL/min   GFR calc Af Amer >60 >60 mL/min    Comment: (NOTE) The eGFR has been calculated using the CKD EPI equation. This calculation has not been validated in all clinical situations. eGFR's persistently <60 mL/min signify possible Chronic Kidney Disease.    Anion gap 10 5 - 15  Protime-INR     Status: None   Collection Time: 12/18/15  6:23 AM  Result Value Ref Range   Prothrombin Time 11.9 11.4 - 15.2 seconds   INR 0.88    Ct Head W  Or Wo Contrast  Result Date: 12/18/2015 CLINICAL DATA:  Severe headache. Pituitary lesion. Rule out cavernous sinus thrombosis or hemorrhage. EXAM: CT HEAD WITHOUT AND WITH CONTRAST TECHNIQUE: Contiguous axial images were obtained from the base of the skull through the vertex without and with intravenous contrast CONTRAST:  7m ISOVUE-300 IOPAMIDOL (ISOVUE-300) INJECTION 61%<Contrast>772mISOVUE-300 IOPAMIDOL (ISOVUE-300) INJECTION 61% COMPARISON:  MRI head 12/11/2015 FINDINGS: Brain: Mixed cystic and solid mass arising from the sella is similar to the prior CT. There is a peripheral rim of enhancement with central well-defined cystic component centrally. The sella is enlarged. No evidence of acute hemorrhage. The enhancing mass extends into the left cavernous sinus unchanged from the prior study. No evidence of cavernous sinus thrombosis. The mass measures 27 mm in height and 22 x 27 mm and transverse dimension. No calcification in the mass. The mass is compressing the optic chiasm and right optic nerve similar to the MRI. Ventricle size is normal. No shift of  the midline structures. Negative for acute infarct. No intracranial hemorrhage. Extra-axial CSF collection over the left frontal lobe is unchanged from the prior study and may be due to a hygroma. Mild prominence of extra axial CSF in the right parietal lobe also unchanged may represent a hygroma. Vascular: Normal arterial and venous enhancement. Skull: Enlargement of the sella.  No other skull lesion. 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 No calcification of the mass to suggest craniopharyngioma. No evidence of acute hemorrhage in the mass. No evidence of cavernous sinus thrombosis. The mass is compressing the optic chiasm and right optic nerve similar to the prior MRI. Electronically Signed   By: ChFranchot Gallo.D.   On: 12/18/2015 08:48    Review of Systems  Constitutional: Positive for malaise/fatigue.  Eyes: Positive for blurred vision, double vision and pain.  Respiratory: Negative.   Cardiovascular: Negative.   Gastrointestinal: Negative.   Genitourinary: Negative.   Musculoskeletal: Negative.   Skin: Negative.   Neurological: Positive for headaches.  Psychiatric/Behavioral: Negative.     Blood pressure 111/75, pulse 64, temperature 98.4 F (36.9 C), temperature source Oral, resp. rate 14, weight 122.5 kg (270 lb), SpO2 95 %. Physical Exam  Constitutional: She is oriented to person, place, and time. She appears well-developed and well-nourished. She appears distressed.  HENT:  Head: Normocephalic and atraumatic.  Right Ear: External ear normal.  Left Ear: External ear normal.  Nose: Nose normal.  Mouth/Throat: Oropharynx is clear and moist.  Eyes: Conjunctivae and EOM are normal. Pupils are equal, round, and reactive to light.  Neck: Normal range of motion. Neck supple.  Cardiovascular: Normal rate, regular rhythm, normal heart sounds and intact distal pulses.   Respiratory:  Effort normal and breath sounds normal.  GI: Soft. Bowel sounds are normal.  Musculoskeletal: Normal range of motion.  Neurological: She is alert and oriented to person, place, and time. She has normal reflexes. A cranial nerve deficit is present. No sensory deficit. Coordination normal. She displays no Babinski's sign on the right side. She displays no Babinski's sign on the left side.  Reflex Scores:      Tricep reflexes are 2+ on the right side and 2+ on the left side.      Bicep reflexes are 2+ on the right side and 2+ on the left side.      Brachioradialis reflexes are 2+ on the right side and 2+ on the left side.  Patellar reflexes are 2+ on the right side and 2+ on the left side.      Achilles reflexes are 2+ on the right side and 2+ on the left side. Bilateral temporal hemianopsia Blurred vision Moving all extremities well Ptosis left eye, pupil round and reactive   Skin: She is not diaphoretic.     Assessment/Plan Will continue with current plan for transsphenoidal resection today. Do not believe there is another entity causing the ptosis other than the existing left cavernous sinus invasion. Though there is no blood present on today's scan, I have reviewed her current imaging and do not believe there is another anatomic reason for the ptosis. I have explained this to Mrs. Hanssen and she understands and is ready for the operation.   Cleda Imel L, MD 12/18/2015, 10:13 AM

## 2015-12-18 NOTE — Progress Notes (Signed)
eLink Physician-Brief Progress Note Patient Name: Laurie Molina DOB: 1960/05/31 MRN: [id number]   Date of Service  12/18/2015  HPI/Events of Note  55 yo woman s./p surgical resection pituitary mass. To ICU on simple face mask, facial dressing in place. Comfortable. Will follow  eICU Interventions       Intervention Category Evaluation Type: New Patient Evaluation  Mylon Mabey S. 12/18/2015, 8:59 PM

## 2015-12-18 NOTE — Progress Notes (Signed)
   ENT Progress Note: Pt for surgery this pm: CRANIOTOMY HYPOPHYSECTOMY TRANSNASAL APPROACH ENDOSCOPIC SINUS SURGERY WITH NAVIGATION   Subjective: Headache and visual loss  Objective: Vital signs in last 24 hours: Temp:  [98.4 F (36.9 C)] 98.4 F (36.9 C) (10/11 0605) Pulse Rate:  [61-83] 69 (10/11 1015) Resp:  [12-23] 23 (10/11 1015) BP: (111-142)/(62-76) 135/75 (10/11 1015) SpO2:  [91 %-100 %] 96 % (10/11 1015) Weight:  [122.5 kg (270 lb)] 122.5 kg (270 lb) (10/11 0450) Weight change:     Intake/Output from previous day: No intake/output data recorded. Intake/Output this shift: Total I/O In: 1000 [IV Piggyback:1000] Out: -   Labs:  Recent Labs  12/18/15 0623  WBC 6.2  HGB 13.5  HCT 41.0  PLT 153    Recent Labs  12/18/15 0623  NA 141  K 4.2  CL 104  CO2 27  GLUCOSE 148*  BUN 25*  CALCIUM 9.3    Studies/Results: Ct Head W Or Wo Contrast  Result Date: 12/18/2015 CLINICAL DATA:  Severe headache. Pituitary lesion. Rule out cavernous sinus thrombosis or hemorrhage. EXAM: CT HEAD WITHOUT AND WITH CONTRAST TECHNIQUE: Contiguous axial images were obtained from the base of the skull through the vertex without and with intravenous contrast CONTRAST:  37mL ISOVUE-300 IOPAMIDOL (ISOVUE-300) INJECTION 61%<Contrast>53mL ISOVUE-300 IOPAMIDOL (ISOVUE-300) INJECTION 61% COMPARISON:  MRI head 12/11/2015 FINDINGS: Brain: Mixed cystic and solid mass arising from the sella is similar to the prior CT. There is a peripheral rim of enhancement with central well-defined cystic component centrally. The sella is enlarged. No evidence of acute hemorrhage. The enhancing mass extends into the left cavernous sinus unchanged from the prior study. No evidence of cavernous sinus thrombosis. The mass measures 27 mm in height and 22 x 27 mm and transverse dimension. No calcification in the mass. The mass is compressing the optic chiasm and right optic nerve similar to the MRI. Ventricle size  is normal. No shift of the midline structures. Negative for acute infarct. No intracranial hemorrhage. Extra-axial CSF collection over the left frontal lobe is unchanged from the prior study and may be due to a hygroma. Mild prominence of extra axial CSF in the right parietal lobe also unchanged may represent a hygroma. Vascular: Normal arterial and venous enhancement. Skull: Enlargement of the sella.  No other skull lesion. 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 No calcification of the mass to suggest craniopharyngioma. No evidence of acute hemorrhage in the mass. No evidence of cavernous sinus thrombosis. The mass is compressing the optic chiasm and right optic nerve similar to the prior MRI. Electronically Signed   By: Franchot Gallo M.D.   On: 12/18/2015 08:48     PHYSICAL EXAM: Patent anterior nasal passage    Assessment/Plan: Pt adm for endoscopic trans-sphenoid pituitary resection.    [place], Dayami Taitt 12/18/2015, 10:55 AM

## 2015-12-18 NOTE — ED Triage Notes (Signed)
Patient reports she it to have surgery today at 1315 by Dr Larose Hires.  Patient states she did attempt to call but no return call by MD.  Patient reports onset of headache today at [place].  She reports this is a worse headache than usual.  She did take percocet at home.  Patient reports she is feeling nauseated.  Patient has not had anything by mouth since 2200.  Patient is alert. Speech is clear.

## 2015-12-18 NOTE — Progress Notes (Signed)
Anesthesiology Follow-up:  Laurie Molina is 55 year old female with a pituitary mass  who underwent  transsphenoidal hypophysectomy today. She tolerated the procedure well but in the recovery room was complaining of frontal headache. She received 400 g of fentanyl during the procedure and 2 mg dilaudid and po vicodin in the PACU. I then gave her a bolus of 80 mcg of precedex over 10 min in the NICU with good pain relief.  Roberts Gaudy

## 2015-12-18 NOTE — Transfer of Care (Signed)
Immediate Anesthesia Transfer of Care Note  Patient: Laurie Molina  Procedure(s) Performed: Procedure(s): CRANIOTOMY HYPOPHYSECTOMY TRANSNASAL APPROACH (N/A) ENDOSCOPIC SINUS SURGERY WITH NAVIGATION (N/A)  Patient Location: PACU  Anesthesia Type:General  Level of Consciousness: awake and alert   Airway & Oxygen Therapy: Patient Spontanous Breathing and Patient connected to face mask oxygen  Post-op Assessment: Report given to RN  Post vital signs: Reviewed and stable  Last Vitals:  Vitals:   12/18/15 1130 12/18/15 1145  BP: 125/75 119/60  Pulse: (!) 130 (!) 59  Resp: 18 13  Temp:      Last Pain:  Vitals:   12/18/15 0755  TempSrc:   PainSc: 1          Complications: No apparent anesthesia complications

## 2015-12-18 NOTE — ED Notes (Signed)
Patient transported to CT 

## 2015-12-18 NOTE — Anesthesia Procedure Notes (Signed)
Procedure Name: Intubation Date/Time: 12/18/2015 2:22 PM Performed by: Jacquiline Doe A Pre-anesthesia Checklist: Patient identified, Emergency Drugs available, Suction available and Patient being monitored Patient Re-evaluated:Patient Re-evaluated prior to inductionOxygen Delivery Method: Circle System Utilized and Circle system utilized Preoxygenation: Pre-oxygenation with 100% oxygen Intubation Type: IV induction and Cricoid Pressure applied Ventilation: Mask ventilation without difficulty and Oral airway inserted - appropriate to patient size Laryngoscope Size: Mac and 3 Grade View: Grade I Tube type: Oral Tube size: 7.5 mm Number of attempts: 1 Airway Equipment and Method: Stylet and Oral airway Placement Confirmation: ETT inserted through vocal cords under direct vision,  positive ETCO2 and breath sounds checked- equal and bilateral Secured at: 21 cm Tube secured with: Tape Dental Injury: Teeth and Oropharynx as per pre-operative assessment

## 2015-12-18 NOTE — ED Provider Notes (Signed)
[facility] DEPT Provider Note   CSN: [id number] Arrival date & time: 12/18/15  0446     History   Chief Complaint Chief Complaint  Patient presents with  . Headache    hx of pituitary tumor.  she is to have surgery today    HPI Laurie Molina is a 55 y.o. female awoke from sleep at 1:00Am  With sharp headache behind her eyes. She states that this is the worst headache of her life, and that it reached peak intensity with the first 30 minutes and she had associated nausea and diaphoresis. .Took 2 percocet and headache is resolving, but is aching and still extremely uncomfortable. She has associated photophobia without phonophobia, neck stiffness or fever..She has not had nausea with percocet in the past She had mild associated nausea. She has noticed worsening ptosis of the left eye over the past week but other neurological complaints (gait abnormality, blurred vision,  imbalance) are chronic. She tried calling her neurosurgeon's office but did not get a return call and came to the ED. She states that her headaches normally resolve completely with her pain meds.    HPI  Past Medical History:  Diagnosis Date  . Depression   . DVT (deep venous thrombosis) ([place]) 2006  . Early cataract    left  . GERD (gastroesophageal reflux disease)   . H/O major depression 1988  . Hypertension   . Morbidly obese ([place])   . OSA on CPAP   . Osteoarthritis   . Pituitary mass ([place])   . PMR (polymyalgia rheumatica) ([facility])   . PONV (postoperative nausea and vomiting)   . Skin cancer   . Spinal headache    from spinal tap had blood patch  . Viral meningitis 1997    Patient Active Problem List   Diagnosis Date Noted  . Gait disorder 07/22/2015    Past Surgical History:  Procedure Laterality Date  . BUNIONECTOMY  2007  . COLONOSCOPY    . LAPAROSCOPIC CHOLECYSTECTOMY  2003  . MYOMECTOMY  2007    OB History    No data available       Home Medications    Prior to Admission medications    Medication Sig Start Date End Date Taking? Authorizing Provider  acetaminophen (TYLENOL) 500 MG tablet Take 1,000 mg by mouth 3 (three) times daily as needed (for pain.).   Yes Historical Provider, MD  aspirin EC 81 MG tablet Take 81 mg by mouth daily.   Yes Historical Provider, MD  Calcium Carb-Cholecalciferol (CALCIUM 600 + D PO) Take 2 tablets by mouth daily.   Yes Historical Provider, MD  FLUoxetine (PROZAC) 20 MG capsule Take 20-40 mg by mouth as directed. Alternating between 20 mg and 40 mg every other day   Yes Historical Provider, MD  losartan (COZAAR) 100 MG tablet Take 100 mg by mouth daily.   Yes Historical Provider, MD  Multiple Vitamin (MULTIVITAMIN) tablet Take 1 tablet by mouth daily.   Yes Historical Provider, MD  omeprazole (PRILOSEC) 20 MG capsule Take 20 mg by mouth daily.   Yes Historical Provider, MD  oxyCODONE-acetaminophen (PERCOCET/ROXICET) 5-325 MG tablet Take 2 tablets by mouth every 4 (four) hours as needed for severe pain.   Yes Historical Provider, MD  predniSONE (DELTASONE) 20 MG tablet Take 20 mg by mouth 2 (two) times daily.   Yes Historical Provider, MD    Family History Family History  Problem Relation Age of Onset  . Hypertension Mother   . Osteoporosis Mother   .  Dementia Paternal Grandmother   . Aortic aneurysm Paternal Grandfather   . Heart attack Maternal Grandfather     Social History Social History  Substance Use Topics  . Smoking status: Never Smoker  . Smokeless tobacco: Never Used  . Alcohol use 0.0 oz/week     Comment: 2-3 per wk     Allergies   No known allergies   Review of Systems Review of Systems  Ten systems reviewed and are negative for acute change, except as noted in the HPI.   Physical Exam Updated Vital Signs BP 130/74 (BP Location: Right Arm)   Pulse 66   Temp 98.4 F (36.9 C) (Oral)   Resp 18   Wt 122.5 kg   SpO2 99%   BMI 45.63 kg/m   Physical Exam  Constitutional: She is oriented to person, place, and  time. She appears well-developed and well-nourished. No distress.  HENT:  Head: Normocephalic and atraumatic.  Mouth/Throat: Oropharynx is clear and moist.  Eyes: Conjunctivae and EOM are normal. Pupils are equal, round, and reactive to light. No scleral icterus.  No horizontal, vertical or rotational nystagmus  Neck: Normal range of motion. Neck supple.  Full active and passive ROM without pain No midline or paraspinal tenderness No nuchal rigidity or meningeal signs  Cardiovascular: Normal rate, regular rhythm and intact distal pulses.   Pulmonary/Chest: Effort normal and breath sounds normal. No respiratory distress. She has no wheezes. She has no rales.  Abdominal: Soft. Bowel sounds are normal. There is no tenderness. There is no rebound and no guarding.  Musculoskeletal: Normal range of motion.  Lymphadenopathy:    She has no cervical adenopathy.  Neurological: She is alert and oriented to person, place, and time. She has normal reflexes. No cranial nerve deficit. She exhibits normal muscle tone. Coordination normal.  Mental Status:  Alert, oriented, thought content appropriate. Speech fluent without evidence of aphasia. Able to follow 2 step commands without difficulty.  Cranial Nerves:  II:  Peripheral visual fields grossly normal, pupils equal, round, reactive to light III,IV, VI: LEFT LID PTOSIS extra-ocular motions intact bilaterally  V,VII: smile symmetric, facial light touch sensation equal VIII: hearing grossly normal bilaterally  IX,X: midline uvula rise  XI: bilateral shoulder shrug equal and strong XII: midline tongue extension  Motor:  5/5 in upper and lower extremities bilaterally including strong and equal grip strength and dorsiflexion/plantar flexion Sensory: Light touch normal in all extremities.  Deep Tendon Reflexes: 2+ and symmetric  Cerebellar: normal finger-to-nose with bilateral upper extremities Gait: deferred CV: distal pulses palpable throughout     Skin: Skin is warm and dry. No rash noted. She is not diaphoretic.  Psychiatric: She has a normal mood and affect. Her behavior is normal. Judgment and thought content normal.  Nursing note and vitals reviewed.    ED Treatments / Results  Labs (all labs ordered are listed, but only abnormal results are displayed) Labs Reviewed  CBC  BASIC METABOLIC PANEL  PROTIME-INR    EKG  EKG Interpretation None       Radiology No results found.  Procedures Procedures (including critical care time)  Medications Ordered in ED Medications - No data to display   Initial Impression / Assessment and Plan / ED Course  I have reviewed the triage vital signs and the nursing notes.  Pertinent labs & imaging results that were available during my care of the patient were reviewed by me and considered in my medical decision making (see chart for details).  Clinical Course  Comment By Time  Patient with negative head CT. I will consult with neurosurgery again. Margarita Mail, PA-C 10/11 [phone]    Patient with headache concerning for potential apoplexy or venous cavernous thrombosis. I have ordered Ct w/wo contrast. I spoke with Dr. Ronnald Ramp who is on call for neurosurgery who agrees with poc. Final Clinical Impressions(s) / ED Diagnoses   Final diagnoses:  None   Patient seen and admitted by Dr. Cyndy Freeze.   New Prescriptions New Prescriptions   No medications on file     Margarita Mail, PA-C 12/18/15 [place], MD 12/27/15 1345

## 2015-12-19 MED ORDER — SALINE SPRAY 0.65 % NA SOLN
4.0000 | NASAL | Status: DC | PRN
  Administered 2015-12-20 – 2015-12-21 (×4): 4 via NASAL
  Filled 2015-12-19: qty 44

## 2015-12-19 MED ORDER — ACETAMINOPHEN 325 MG PO TABS
650.0000 mg | ORAL_TABLET | Freq: Four times a day (QID) | ORAL | Status: DC | PRN
  Administered 2015-12-19 – 2015-12-27 (×12): 650 mg via ORAL
  Filled 2015-12-19 (×12): qty 2

## 2015-12-19 NOTE — Progress Notes (Signed)
ENT Progress Note: POD #1 s/p Procedure(s): CRANIOTOMY HYPOPHYSECTOMY TRANSNASAL APPROACH ENDOSCOPIC SINUS SURGERY WITH NAVIGATION   Subjective: C/o H/A and N/V  Objective: Vital signs in last 24 hours: Temp:  [97.7 F (36.5 C)-98.6 F (37 C)] 98.6 F (37 C) (10/12 0400) Pulse Rate:  [51-130] 51 (10/12 0600) Resp:  [8-23] 16 (10/12 0600) BP: (100-158)/(55-82) 148/74 (10/12 0600) SpO2:  [91 %-100 %] 93 % (10/12 0600) Arterial Line BP: (109-203)/(62-109) 172/79 (10/12 0600) Weight:  [127.4 kg (280 lb 13.9 oz)] 127.4 kg (280 lb 13.9 oz) (10/11 1900) Weight change: 4.929 kg (10 lb 13.9 oz)    Intake/Output from previous day: 10/11 0701 - 10/12 0700 In: 3820.7 [I.V.:2670.7; IV Piggyback:1150] Out: 2275 [Urine:2075; Blood:200] Intake/Output this shift: Total I/O In: 320.7 [I.V.:170.7; IV Piggyback:150] Out: 675 [Urine:675]  Labs:  Recent Labs  12/18/15 0623  WBC 6.2  HGB 13.5  HCT 41.0  PLT 153    Recent Labs  12/18/15 0623  NA 141  K 4.2  CL 104  CO2 27  GLUCOSE 148*  BUN 25*  CALCIUM 9.3    Studies/Results: Ct Head W Or Wo Contrast  Result Date: 12/18/2015 CLINICAL DATA:  Severe headache. Pituitary lesion. Rule out cavernous sinus thrombosis or hemorrhage. EXAM: CT HEAD WITHOUT AND WITH CONTRAST TECHNIQUE: Contiguous axial images were obtained from the base of the skull through the vertex without and with intravenous contrast CONTRAST:  41mL ISOVUE-300 IOPAMIDOL (ISOVUE-300) INJECTION 61%<Contrast>36mL ISOVUE-300 IOPAMIDOL (ISOVUE-300) INJECTION 61% COMPARISON:  MRI head 12/11/2015 FINDINGS: Brain: Mixed cystic and solid mass arising from the sella is similar to the prior CT. There is a peripheral rim of enhancement with central well-defined cystic component centrally. The sella is enlarged. No evidence of acute hemorrhage. The enhancing mass extends into the left cavernous sinus unchanged from the prior study. No evidence of cavernous sinus thrombosis. The  mass measures 27 mm in height and 22 x 27 mm and transverse dimension. No calcification in the mass. The mass is compressing the optic chiasm and right optic nerve similar to the MRI. Ventricle size is normal. No shift of the midline structures. Negative for acute infarct. No intracranial hemorrhage. Extra-axial CSF collection over the left frontal lobe is unchanged from the prior study and may be due to a hygroma. Mild prominence of extra axial CSF in the right parietal lobe also unchanged may represent a hygroma. Vascular: Normal arterial and venous enhancement. Skull: Enlargement of the sella.  No other skull lesion. 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 No calcification of the mass to suggest craniopharyngioma. No evidence of acute hemorrhage in the mass. No evidence of cavernous sinus thrombosis. The mass is compressing the optic chiasm and right optic nerve similar to the prior MRI. Electronically Signed   By: Franchot Gallo M.D.   On: 12/18/2015 08:48   Dg Chest Port 1 View  Result Date: 12/18/2015 CLINICAL DATA:  Central line placement EXAM: PORTABLE CHEST 1 VIEW COMPARISON:  None. FINDINGS: Multiple support leads obscure portions of the chest. A right sided central venous catheter has been placed, the tip overlies the SVC. There is no right-sided pneumothorax. There are low lung volumes. There is left perihilar and basilar opacity. Borderline cardiomegaly. No pneumothorax. IMPRESSION: 1. Right-sided jugular catheter tip overlies the SVC. No pneumothorax. 2. Markedly low lung volumes. Left perihilar and basilar pulmonary opacity could relate to atelectasis or infiltrate. Possible small left effusion. 3.  Borderline to mild cardiomegaly, augmented by the low depth of inspiration. Electronically Signed   By: Donavan Foil M.D.   On: 12/18/2015 19:04     PHYSICAL EXAM: Min bloody d/c Vision  intact   Assessment/Plan: Stable POD#1 Begin saline nasal spray    Jonael Paradiso 12/19/2015, 6:56 AM

## 2015-12-19 NOTE — Op Note (Signed)
NAMESHELIE, KLITZKE NO.:  [id number]  MEDICAL RECORD NO.:  [id number]  LOCATION:  3M07C                        FACILITY:  [facility]  PHYSICIAN:  Early Chars. Wilburn Cornelia, M.D.DATE OF BIRTH:  1960-04-15  DATE OF PROCEDURE:  12/18/2015 DATE OF DISCHARGE:                              OPERATIVE REPORT   PREOPERATIVE DIAGNOSIS:  Pituitary mass.  POSTOPERATIVE DIAGNOSIS:  Pituitary mass.  INDICATION FOR SURGERY:  Pituitary mass.  PROCEDURE:  Endoscopic transsphenoidal pituitary resection.  ENT SURGEON:  Dr. Wilburn Cornelia.  NEUROSURGEON:  Dr. Ashok Pall.  ANESTHESIA:  General endotracheal.  COMPLICATIONS:  No complications.  ESTIMATED BLOOD LOSS:  Approximately 150 mL.  The patient was transferred from the operating room to the recovery room in stable condition.  BRIEF HISTORY:  The patient is a 55 year old white female who was referred to Dr. Christella Noa for evaluation of a pituitary mass found on MRI scan.  She had a history of chronic headaches and worsening vision.  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 The patient continued to have visual changes.  She was evaluated by our ophthalmologist and concerns were raised regarding possible visual field defect.  MRI scan was obtained, which showed a large cystic appearing mass within the central component of the pituitary gland with extension superiorly and affecting the optic chiasm as well as laterally into the cavernous sinus bilaterally.  Based on the patient's history and findings with changes in vision and progression in symptoms, she was scheduled on an urgent basis to undergo endoscopic transsphenoidal pituitary resection.  The risks, benefits, and possible complications of the procedure were discussed in detail.  The ENT related nasal concerns were discussed preoperatively and the patient understood and  agreed with the plan for surgery.  Prior to operation, a CT scan of the sinuses was obtained with navigation formatting.  The navigation device was used throughout the sinus component of the surgery.  SURGICAL PROCEDURE:  The patient was brought to the operating room on December 18, 2015 and placed in supine position on the operating table. General endotracheal anesthesia was established without difficulty. With the patient adequately anesthetized, she was positioned.  A surgical time-out was then performed and correct identification of the patient and the surgical procedure.  The X[employer] navigation headgear was applied and anatomic and surgical landmarks were identified and confirmed.  The patient was then prepped, draped, and prepared for her surgical procedure.  Her nose was injected with a total of 6 mL of 1% lidocaine with 1:100,000 epinephrine was injected in the submucosal fashion along the middle turbinate, lateral nasal wall, posterior nasal septum, and sphenoid rostrum as well as a transoral sphenopalatine block.  The patient's nose then packed with Afrin-soaked cottonoid pledgets and were left in place for approximately 10 minutes to allow for vasoconstriction hemostasis.  The patient was prepped, draped, and prepared for surgery.  Bilateral endoscopic sinus surgery was undertaken.  This consisted of bilateral total ethmoidectomies and bilateral sphenoidotomies.  Began the patient's left-hand side using a 0-degree endoscope and a straight microdebrider.  Total ethmoidectomy was performed beginning with reflection  of the uncinate process and dissection to the anterior and posterior ethmoid air cells creating a widely patent ethmoid cavity. The middle turbinate was then medialized and the sphenoid ostium on the left-hand side was identified.  Then using a trans-ethmoid approach, the posterior aspect of the middle turbinate and inferior aspect of the superior turbinate were  resected and the natural ostium of the sinus was enlarged in a superolateral direction.  In the posterior aspect of the ethmoid sinus, the posterior ethmoid artery was encountered.  There was a moderate amount of arterial bleeding, which was managed with monopolar suction cautery and packing.  Attention was then turned to the patient's right-hand side.  Right endoscopic sinus surgery was undertaken with medialization of the middle turbinate, resection of uncinate process, and then total ethmoidectomy using a microdebrider and the 0-degree endoscope.  The posterior aspect of the ethmoid air cells were identified and the posterior middle turbinate and inferior aspect of the superior turbinate were resected. The natural ostium of the sphenoid sinus was identified and enlarged laterally and superiorly.  The patient had a very hypoplastic right sphenoid sinus and the intrasinus septum curved significantly to the right.  With the sinus component, the surgery completed.  The nasal endoscope was advanced on the patient's right-hand side and the posterior nasal septum was transected.  Then using a through-cutting forceps, the intervening soft tissue and thin bone were resected to create a posterior septal approach to the anterior face of the sphenoid.  The intersphenoid sinus septum was then resected and the 2 sides were communicated creating widely patent port for access.  The sphenoid sinus was explored and the anterior face of the pituitary sella was identified.  With the sinus component of the surgery completed, Dr. Christella Noa began the neurosurgical part which is dictated as a separate operative report. This was performed under direct visualization using a 0-degree endoscope in the right nostril and surgical instrumentation in the left allowing direct access to the pituitary sella and tumor.  Once the neurosurgical component procedure was completed, the patient's nasal cavity was irrigated  and suctioned.  There was no active bleeding. Surgical debris was cleared from the sinuses bilaterally, and the entire surgical field was reinspected.  Again, no significant bleeding. NasoPore absorbable packing was then placed in the sphenoid sinus as well as in the posterior ethmoid sinuses.  The nasopharynx was then irrigated and suctioned.  An orogastric tube was passed and stomach contents were aspirated.  The patient was then awakened from her anesthetic.  She was extubated and transferred from the operating room to the recovery room in stable condition.  No complications.  ESTIMATED BLOOD LOSS:  150 mL.          ______________________________ Early Chars. Wilburn Cornelia, M.D.    DLS/MEDQ  D:  [id number]  T:  12/19/2015  Job:  [id number]

## 2015-12-19 NOTE — Progress Notes (Signed)
Patient ID: Buford Cater, female   DOB: 05/01/1960, 55 y.o.   MRN: [id number] Subjective: Patient reports some headache, not progressive, some emesis at night. Some bradycardia. No visual changes.  Objective: Vital signs in last 24 hours: Temp:  [97.7 F (36.5 C)-98.6 F (37 C)] 98.6 F (37 C) (10/12 0400) Pulse Rate:  [49-130] 49 (10/12 0700) Resp:  [8-23] 11 (10/12 0700) BP: (100-158)/(55-82) 128/67 (10/12 0700) SpO2:  [89 %-100 %] 89 % (10/12 0700) Arterial Line BP: (109-203)/(62-109) 153/70 (10/12 0700) Weight:  [127.4 kg (280 lb 13.9 oz)] 127.4 kg (280 lb 13.9 oz) (10/11 1900)  Intake/Output from previous day: 10/11 0701 - 10/12 0700 In: 4640.7 [I.V.:3390.7; IV Piggyback:1250] Out: 2625 [Urine:2425; Blood:200] Intake/Output this shift: No intake/output data recorded.  Neurologic: Grossly normal  Lab Results: Lab Results  Component Value Date   WBC 6.2 12/18/2015   HGB 13.5 12/18/2015   HCT 41.0 12/18/2015   MCV 93.6 12/18/2015   PLT 153 12/18/2015   Lab Results  Component Value Date   INR 0.88 12/18/2015   BMET Lab Results  Component Value Date   NA 141 12/18/2015   K 4.2 12/18/2015   CL 104 12/18/2015   CO2 27 12/18/2015   GLUCOSE 148 (H) 12/18/2015   BUN 25 (H) 12/18/2015   CREATININE 0.76 12/18/2015   CALCIUM 9.3 12/18/2015    Studies/Results: Ct Head W Or Wo Contrast  Result Date: 12/18/2015 CLINICAL DATA:  Severe headache. Pituitary lesion. Rule out cavernous sinus thrombosis or hemorrhage. EXAM: CT HEAD WITHOUT AND WITH CONTRAST TECHNIQUE: Contiguous axial images were obtained from the base of the skull through the vertex without and with intravenous contrast CONTRAST:  52mL ISOVUE-300 IOPAMIDOL (ISOVUE-300) INJECTION 61%<Contrast>24mL ISOVUE-300 IOPAMIDOL (ISOVUE-300) INJECTION 61% COMPARISON:  MRI head 12/11/2015 FINDINGS: Brain: Mixed cystic and solid mass arising from the sella is similar to the prior CT. There is a peripheral rim of enhancement  with central well-defined cystic component centrally. The sella is enlarged. No evidence of acute hemorrhage. The enhancing mass extends into the left cavernous sinus unchanged from the prior study. No evidence of cavernous sinus thrombosis. The mass measures 27 mm in height and 22 x 27 mm and transverse dimension. No calcification in the mass. The mass is compressing the optic chiasm and right optic nerve similar to the MRI. Ventricle size is normal. No shift of the midline structures. Negative for acute infarct. No intracranial hemorrhage. Extra-axial CSF collection over the left frontal lobe is unchanged from the prior study and may be due to a hygroma. Mild prominence of extra axial CSF in the right parietal lobe also unchanged may represent a hygroma. Vascular: Normal arterial and venous enhancement. Skull: Enlargement of the sella.  No other skull lesion. 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 No calcification of the mass to suggest craniopharyngioma. No evidence of acute hemorrhage in the mass. No evidence of cavernous sinus thrombosis. The mass is compressing the optic chiasm and right optic nerve similar to the prior MRI. Electronically Signed   By: Franchot Gallo M.D.   On: 12/18/2015 08:48   Dg Chest Port 1 View  Result Date: 12/18/2015 CLINICAL DATA:  Central line placement EXAM: PORTABLE CHEST 1 VIEW COMPARISON:  None. FINDINGS: Multiple support leads obscure portions of the chest. A right sided central venous catheter has been placed, the tip overlies the SVC. There is no right-sided pneumothorax. There are low lung volumes.  There is left perihilar and basilar opacity. Borderline cardiomegaly. No pneumothorax. IMPRESSION: 1. Right-sided jugular catheter tip overlies the SVC. No pneumothorax. 2. Markedly low lung volumes. Left perihilar and basilar pulmonary opacity could relate to atelectasis or  infiltrate. Possible small left effusion. 3. Borderline to mild cardiomegaly, augmented by the low depth of inspiration. Electronically Signed   By: Donavan Foil M.D.   On: 12/18/2015 19:04    Assessment/Plan: Seems to be doing well postop day 1. We'll try to control emesis. Mobilize today. Continue hydrocortisone.   LOS: 1 day    Aziel Morgan S 12/19/2015, 7:56 AM

## 2015-12-19 NOTE — Progress Notes (Signed)
Nutrition Brief Note  Patient identified on the Malnutrition Screening Tool (MST) Report  Per pt she lost weight in May 2017 but her weight has been stable since and she has a good appetite.   Wt Readings from Last 15 Encounters:  12/18/15 280 lb 13.9 oz (127.4 kg)  07/22/15 290 lb (131.5 kg)    Body mass index is 46.74 kg/m. Patient meets criteria for Obesity Class III based on current BMI.   Current diet order is Clears. Labs and medications reviewed.   No nutrition interventions warranted at this time. If nutrition issues arise, please consult RD.   Maylon Peppers RD, [place], [place] Pager [phone] After Hours Pager'

## 2015-12-20 ENCOUNTER — Encounter (HOSPITAL_COMMUNITY): Payer: Self-pay | Admitting: Neurosurgery

## 2015-12-20 LAB — BASIC METABOLIC PANEL
ANION GAP: 6 (ref 5–15)
BUN: 10 mg/dL (ref 6–20)
CALCIUM: 8.6 mg/dL — AB (ref 8.9–10.3)
CO2: 30 mmol/L (ref 22–32)
CREATININE: 0.75 mg/dL (ref 0.44–1.00)
Chloride: 100 mmol/L — ABNORMAL LOW (ref 101–111)
GFR calc Af Amer: >60 mL/min (ref >60)
GLUCOSE: 105 mg/dL — AB (ref 65–99)
Potassium: 3.7 mmol/L (ref 3.5–5.1)
Sodium: 136 mmol/L (ref 135–145)

## 2015-12-20 MED ORDER — DEXAMETHASONE SODIUM PHOSPHATE 10 MG/ML IJ SOLN
10.0000 mg | Freq: Once | INTRAMUSCULAR | Status: AC
  Administered 2015-12-20: 10 mg via INTRAVENOUS
  Filled 2015-12-20: qty 1

## 2015-12-20 MED ORDER — HYDROCORTISONE 5 MG PO TABS
5.0000 mg | ORAL_TABLET | Freq: Every day | ORAL | Status: DC
  Administered 2015-12-20: 5 mg via ORAL
  Filled 2015-12-20 (×3): qty 1

## 2015-12-20 NOTE — Progress Notes (Signed)
Patient ID: Laurie Molina, female   DOB: 1960-04-15, 55 y.o.   MRN: [id number] Subjective: Patient reports constant aching headache to become sharp with movement. No visual changes. No numbness tingling or weakness.  Objective: Vital signs in last 24 hours: Temp:  [97.9 F (36.6 C)-98.7 F (37.1 C)] 97.9 F (36.6 C) (10/13 0334) Pulse Rate:  [48-64] 55 (10/13 0700) Resp:  [11-21] 14 (10/13 0700) BP: (119-150)/(68-87) 149/87 (10/13 0700) SpO2:  [92 %-99 %] 99 % (10/13 0700) Arterial Line BP: (144-162)/(74-80) 162/80 (10/12 0900)  Intake/Output from previous day: 10/12 0701 - 10/13 0700 In: 3330 [P.O.:640; I.V.:2640; IV Piggyback:50] Out: [id number] [Urine:1840] Intake/Output this shift: No intake/output data recorded.  Neurologic: Grossly normal, ptosis seems better, pupils okay  Lab Results: Lab Results  Component Value Date   WBC 6.2 12/18/2015   HGB 13.5 12/18/2015   HCT 41.0 12/18/2015   MCV 93.6 12/18/2015   PLT 153 12/18/2015   Lab Results  Component Value Date   INR 0.88 12/18/2015   BMET Lab Results  Component Value Date   NA 141 12/18/2015   K 4.2 12/18/2015   CL 104 12/18/2015   CO2 27 12/18/2015   GLUCOSE 148 (H) 12/18/2015   BUN 25 (H) 12/18/2015   CREATININE 0.76 12/18/2015   CALCIUM 9.3 12/18/2015    Studies/Results: Ct Head W Or Wo Contrast  Result Date: 12/18/2015 CLINICAL DATA:  Severe headache. Pituitary lesion. Rule out cavernous sinus thrombosis or hemorrhage. EXAM: CT HEAD WITHOUT AND WITH CONTRAST TECHNIQUE: Contiguous axial images were obtained from the base of the skull through the vertex without and with intravenous contrast CONTRAST:  22mL ISOVUE-300 IOPAMIDOL (ISOVUE-300) INJECTION 61%<Contrast>62mL ISOVUE-300 IOPAMIDOL (ISOVUE-300) INJECTION 61% COMPARISON:  MRI head 12/11/2015 FINDINGS: Brain: Mixed cystic and solid mass arising from the sella is similar to the prior CT. There is a peripheral rim of enhancement with central well-defined cystic  component centrally. The sella is enlarged. No evidence of acute hemorrhage. The enhancing mass extends into the left cavernous sinus unchanged from the prior study. No evidence of cavernous sinus thrombosis. The mass measures 27 mm in height and 22 x 27 mm and transverse dimension. No calcification in the mass. The mass is compressing the optic chiasm and right optic nerve similar to the MRI. Ventricle size is normal. No shift of the midline structures. Negative for acute infarct. No intracranial hemorrhage. Extra-axial CSF collection over the left frontal lobe is unchanged from the prior study and may be due to a hygroma. Mild prominence of extra axial CSF in the right parietal lobe also unchanged may represent a hygroma. Vascular: Normal arterial and venous enhancement. Skull: Enlargement of the sella.  No other skull lesion. 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 No calcification of the mass to suggest craniopharyngioma. No evidence of acute hemorrhage in the mass. No evidence of cavernous sinus thrombosis. The mass is compressing the optic chiasm and right optic nerve similar to the prior MRI. Electronically Signed   By: Franchot Gallo M.D.   On: 12/18/2015 08:48   Dg Chest Port 1 View  Result Date: 12/18/2015 CLINICAL DATA:  Central line placement EXAM: PORTABLE CHEST 1 VIEW COMPARISON:  None. FINDINGS: Multiple support leads obscure portions of the chest. A right sided central venous catheter has been placed, the tip overlies the SVC. There is no right-sided pneumothorax. There are low lung volumes. There is left perihilar and basilar opacity. Borderline  cardiomegaly. No pneumothorax. IMPRESSION: 1. Right-sided jugular catheter tip overlies the SVC. No pneumothorax. 2. Markedly low lung volumes. Left perihilar and basilar pulmonary opacity could relate to atelectasis or infiltrate. Possible small left  effusion. 3. Borderline to mild cardiomegaly, augmented by the low depth of inspiration. Electronically Signed   By: Donavan Foil M.D.   On: 12/18/2015 19:04    Assessment/Plan: Continue hydrocortisone. Try one dose of IV Decadron for headache. I do not believe she requires CT of the head at this point. Continue to mobilize. Ins and outs lok good. Check BMP.   LOS: 2 days    Mariadejesus Cade S 12/20/2015, 7:48 AM

## 2015-12-21 MED ORDER — HYDROCORTISONE 10 MG PO TABS
10.0000 mg | ORAL_TABLET | Freq: Every morning | ORAL | Status: DC
  Administered 2015-12-22: 10 mg via ORAL
  Filled 2015-12-21: qty 1

## 2015-12-21 MED ORDER — SALINE SPRAY 0.65 % NA SOLN
4.0000 | NASAL | Status: DC | PRN
Start: 2015-12-21 — End: 2015-12-27
  Filled 2015-12-21: qty 44

## 2015-12-21 MED ORDER — FAMOTIDINE 20 MG PO TABS
20.0000 mg | ORAL_TABLET | Freq: Two times a day (BID) | ORAL | Status: DC
  Administered 2015-12-22 – 2015-12-27 (×10): 20 mg via ORAL
  Filled 2015-12-21 (×11): qty 1

## 2015-12-21 MED ORDER — HYDROCORTISONE 5 MG PO TABS
5.0000 mg | ORAL_TABLET | Freq: Every evening | ORAL | Status: DC
  Administered 2015-12-21: 5 mg via ORAL
  Filled 2015-12-21: qty 1

## 2015-12-21 MED ORDER — DEXAMETHASONE SODIUM PHOSPHATE 10 MG/ML IJ SOLN
10.0000 mg | Freq: Once | INTRAMUSCULAR | Status: AC
  Administered 2015-12-21: 10 mg via INTRAVENOUS
  Filled 2015-12-21: qty 1

## 2015-12-21 MED ORDER — DEXAMETHASONE 2 MG PO TABS
2.0000 mg | ORAL_TABLET | Freq: Three times a day (TID) | ORAL | Status: DC
  Administered 2015-12-21 – 2015-12-22 (×2): 2 mg via ORAL
  Filled 2015-12-21 (×2): qty 1

## 2015-12-21 NOTE — Progress Notes (Signed)
   ENT Progress Note: POD #3 s/p Procedure(s): CRANIOTOMY HYPOPHYSECTOMY TRANSNASAL APPROACH ENDOSCOPIC SINUS SURGERY WITH NAVIGATION   Subjective: H/A and nausea  Objective: Vital signs in last 24 hours: Temp:  [97.7 F (36.5 C)-99.2 F (37.3 C)] 99.2 F (37.3 C) (10/14 1001) Pulse Rate:  [48-82] 64 (10/14 1001) Resp:  [10-18] 17 (10/14 1001) BP: (118-162)/(64-98) 141/70 (10/14 1001) SpO2:  [89 %-97 %] 95 % (10/14 1001) Weight change:  Last BM Date: 12/18/15  Intake/Output from previous day: 10/13 0701 - 10/14 0700 In: 1940 [P.O.:420; I.V.:1520] Out: 1300 [Urine:1300] Intake/Output this shift: No intake/output data recorded.  Labs: No results for input(s): WBC, HGB, HCT, PLT in the last 72 hours.  Recent Labs  12/20/15 0641  NA 136  K 3.7  CL 100*  CO2 30  GLUCOSE 105*  BUN 10  CALCIUM 8.6*    Studies/Results: No results found.   PHYSICAL EXAM: No bleeding or nasal d/c Neuro intact   Assessment/Plan: Stable since surgery Cont sx of H/A and nausea    Laurie Molina 12/21/2015, 10:43 AM

## 2015-12-21 NOTE — Progress Notes (Signed)
Pt requested for RN to inform MD to put her on a scheduled decadron because decadron helps her feel better and makes her headaches go away. Dr. Cyndy Freeze notified and new order received. Will continue to assess pt. Delia Heady RN

## 2015-12-21 NOTE — Progress Notes (Signed)
Patient in bed, denies pain/discomfort. Requests that her door stay closed as she is trying to go back to sleep. Will continue to monitor.

## 2015-12-21 NOTE — Progress Notes (Signed)
No issues overnight. Cont to report frontal/occipital HA and nausea. Hasn't vomited since yesterday. No vision changes.  EXAM:  BP (!) 141/70 (BP Location: Left Arm)   Pulse 64   Temp 99.2 F (37.3 C) (Oral)   Resp 17   Ht 5\' 5"  (1.651 m)   Wt 127.4 kg (280 lb 13.9 oz)   SpO2 95%   BMI 46.74 kg/m   Awake, alert, oriented  Speech fluent, appropriate  CN grossly intact  5/5 BUE/BLE   IMPRESSION:  55 y.o. female s/p endoscopic transsphenoidal resection of tumor. Has postop HA and nausea.   PLAN: - Will increase hydrocortisone to 10mg  Qam and 5mg  Qpm - IV decadron x1 now - Cont to mobilize as tolerated

## 2015-12-22 MED ORDER — PREDNISONE 20 MG PO TABS
20.0000 mg | ORAL_TABLET | Freq: Two times a day (BID) | ORAL | Status: DC
  Administered 2015-12-23 – 2015-12-27 (×9): 20 mg via ORAL
  Filled 2015-12-22 (×9): qty 1

## 2015-12-22 MED ORDER — HYDROCORTISONE 10 MG PO TABS
10.0000 mg | ORAL_TABLET | Freq: Every evening | ORAL | Status: DC
  Administered 2015-12-22: 10 mg via ORAL
  Filled 2015-12-22: qty 1

## 2015-12-22 MED ORDER — HYDROCORTISONE 20 MG PO TABS: 20.0000 mg | ORAL_TABLET | Freq: Every morning | ORAL | Status: DC

## 2015-12-22 MED ORDER — PREDNISONE 5 MG PO TABS
10.0000 mg | ORAL_TABLET | Freq: Once | ORAL | Status: AC
  Administered 2015-12-22: 10 mg via ORAL
  Filled 2015-12-22: qty 2

## 2015-12-22 NOTE — Progress Notes (Signed)
Had extensive discussion with patient regarding management of her steroids. She is on steroids prior to admission for polymyalgia rheumatica, her outpatient rheumatologist reportedly recommended for the patient not to be on less than prednisone 40 mg per day. She has been on prednisone 20 mg po BID since June of this year. RN spoke with on call neurosurgeon and got verbal order for more steroids. Since patient received dexamethasone and hydrocortisone earlier today, will give a smaller dose of prednisone and then will resume her outpatient steroid regimen tomorrow.   Hughes Better, PharmD, BCPS Clinical Pharmacist 12/22/2015 6:48 PM

## 2015-12-22 NOTE — Progress Notes (Signed)
Patient requested decadron for management of symptoms of adrenal insufficiency yesterday Vision subjectively improved Awake and alert Moving all extremities well D/c decadron Increase Cortef to 20/10 at 0800 and 1600

## 2015-12-22 NOTE — Progress Notes (Signed)
   ENT Progress Note: POD #4 s/p Procedure(s): CRANIOTOMY HYPOPHYSECTOMY TRANSNASAL APPROACH ENDOSCOPIC SINUS SURGERY WITH NAVIGATION   Subjective: Improved H/A and nausea  Objective: Vital signs in last 24 hours: Temp:  [97.6 F (36.4 C)-99.2 F (37.3 C)] 98.5 F (36.9 C) (10/15 0625) Pulse Rate:  [52-69] 54 (10/15 0625) Resp:  [17-18] 18 (10/15 0625) BP: (141-159)/(63-88) 147/84 (10/15 0625) SpO2:  [91 %-98 %] 98 % (10/15 0625) Weight change:  Last BM Date: 12/18/15  Intake/Output from previous day: 10/14 0701 - 10/15 0700 In: -  Out: 430 [Urine:430] Intake/Output this shift: No intake/output data recorded.  Labs: No results for input(s): WBC, HGB, HCT, PLT in the last 72 hours.  Recent Labs  12/20/15 0641  NA 136  K 3.7  CL 100*  CO2 30  GLUCOSE 105*  BUN 10  CALCIUM 8.6*    Studies/Results: No results found.   PHYSICAL EXAM: Dry nasal mucosa No bleeding   Assessment/Plan: Pt improved with increased steroids, c/o peripheral edema Cont saline and nasal precautions Plan d/c on po Abx (Keflex 500 tid x 10d) when stable F/u OP ~2 wks post op    Laurie Molina 12/22/2015, 9:15 AM

## 2015-12-23 NOTE — Plan of Care (Signed)
Problem: Activity: Goal: Risk for activity intolerance will decrease Outcome: Progressing Ambulates to BR using walker, steady on her feet. Sitting inchair a lot.

## 2015-12-23 NOTE — Care Management Note (Signed)
Case Management Note  Patient Details  Name: Laurie Molina MRN: [id number] Date of Birth: [birth year]  Subjective/Objective:    Pt s/p craniotomy for pituitary mass. She is from home with her spouse.               Action/Plan: Plan is for patient to discharge home when medically ready. CM following for d/c needs.   Expected Discharge Date:                  Expected Discharge Plan:  Home/Self Care  In-House Referral:     Discharge planning Services     Post Acute Care Choice:    Choice offered to:     DME Arranged:    DME Agency:     HH Arranged:    HH Agency:     Status of Service:  In process, will continue to follow  If discussed at Long Length of Stay Meetings, dates discussed:    Additional Comments:  Pollie Friar, RN 12/23/2015, 11:17 AM

## 2015-12-23 NOTE — Progress Notes (Signed)
Called to speak with the patient and her significant other. The patient chose to let her significant other do all of the talking. He complained that the patient was not receiving the right dosage of prednisone for several days. Although from the nurse's documentation yesterday the patient requested steroids "because it helps her feel better." I explained to him that the Rns cannot give medications without the physician's orders. He said that this was un-acceptable. He is angry, demanding that I find Dr. Christella Noa immediately and drag him to the room. I notified him that while I could not contact Dr. Annamarie Dawley directly at this time, I could contact the MD on call. He stated "I have already done that and it was Dr. Ronnald Ramp and he stated he didn't know where Dr. Christella Noa is at either." He continued to yell at this writer and demand Dr. Christella Noa be brought to the room immediately. I re-iterated that I do not have Dr. Lacy Duverney number and can only call the on-call physician. I also told him that Dr. Christella Noa typically sees his patients in the evening and would more than likely be by soon but I would still be happy to call the on-call. He then stated "you are a liar, I want to see hospital administration."   I again stated I would call the on-call physician and hospital administration and they can address the situation. He again began to escalate with this Probation officer and stated "You've said 5 times that there is nothing you can do." I again stated I would call the on-call physician and hospital administration. He continued to yell and try to intimidate this Probation officer to the point that this Probation officer again re-iterated that I would call the on-call physician and hospital administration and I then left the room.   On-call physician Dr. Annette Stable paged and notified of situation. He stated that he had already spoke with the significant other and told him that he was trying to find Dr. Christella Noa. He stated he would continue to try to find Dr.  Christella Noa. Ron, [facility] also called and notified of the significant other's request to speak with administration immediately. Ron stated that he "is doing staffing but either he or Darrick Penna would come speak with him soon."  Shortly after this, this writer was called into another situation and could not immediately go back to the room. Claiborne Billings, RN relayed the information to the significant other.   Gwyndolyn Saxon, [place] is the assigned RN for the patient tonight and has been notified of the situation.

## 2015-12-24 NOTE — Addendum Note (Signed)
Addendum  created 12/24/15 0856 by Josephine Igo, CRNA   Anesthesia Intra Meds edited

## 2015-12-24 NOTE — Progress Notes (Signed)
Patient ID: Laurie Molina, female   DOB: 04-Aug-1960, 54 y.o.   MRN: [id number]  BP 124/63 (BP Location: Left Arm)   Pulse 64   Temp 99 F (37.2 C) (Oral)   Resp 18   Ht 5\' 5"  (1.651 m)   Wt 127.4 kg (280 lb 13.9 oz)   SpO2 93%   BMI 46.74 kg/m  Alert and oriented x 4 Speech is clear and fluent Moving all extremities Will consult pt. May be out of bed. On correct dose of steroids

## 2015-12-25 NOTE — Evaluation (Signed)
Physical Therapy Evaluation Patient Details Name: Laurie Molina MRN: [id number] DOB: 08-Jan-1961 Today's Date: 12/25/2015   History of Present Illness  s/p CRANIOTOMY HYPOPHYSECTOMY TRANSNASAL APPROACH; PMHx: HTN, morbid obesity, PMR  Clinical Impression  Pt admitted with above diagnosis. Pt currently with functional limitations due to the deficits listed below (see PT Problem List). * Pt will benefit from skilled PT to increase their independence and safety with mobility to allow discharge to the venue listed below.    Pt did well today with less dizziness upon second  amb trial; reports dizziness with standing--sitting BP 118/74, standing after 1 min 130/77; pt states she feels like her "brain is spinning" inside her head; may benefit from Vestibular assessment      Follow Up Recommendations Home health PT (vs no f/u depending on progress/safety)    Equipment Recommendations  Rolling walker with 5" wheels (wide)    Recommendations for Other Services       Precautions / Restrictions Precautions Precautions: Fall Restrictions Weight Bearing Restrictions: No      Mobility  Bed Mobility               General bed mobility comments: NT--pt in chair  Transfers Overall transfer level: Needs assistance Equipment used: Rolling walker (2 wheeled) Transfers: Sit to/from Stand Sit to Stand: Min guard         General transfer comment: cues for hand placement, safety, to slow speed of transitions d/t dizziness  Ambulation/Gait Ambulation/Gait assistance: Min assist;Min guard Ambulation Distance (Feet): 45 Feet (x2) Assistive device: Rolling walker (2 wheeled) Gait Pattern/deviations: Step-through pattern;Decreased stride length     General Gait Details: slow but steady gait, dizziness reported throughout but better on return trip; pt states she feels like her "brain is spinning"  Stairs            Wheelchair Mobility    Modified Rankin (Stroke Patients Only)        Balance Overall balance assessment: Needs assistance           Standing balance-Leahy Scale: Fair Standing balance comment: pt is able to maintain static standing without LOB, reports dizziness/unchanged with time; BP stable                             Pertinent Vitals/Pain Pain Assessment: No/denies pain    Home Living Family/patient expects to be discharged to:: Private residence Living Arrangements: Spouse/significant other Available Help at Discharge: Available PRN/intermittently Type of Home: House Home Access: Stairs to enter Entrance Stairs-Rails: None Entrance Stairs-Number of Steps: 2 and 2 Home Layout: Two level;Full bath on main level Home Equipment: None      Prior Function Level of Independence: Independent         Comments: going up/down stairs was difficult and tiring prior to surgery per pt     Hand Dominance        Extremity/Trunk Assessment   Upper Extremity Assessment: Overall WFL for tasks assessed           Lower Extremity Assessment: Overall WFL for tasks assessed         Communication   Communication: No difficulties  Cognition Arousal/Alertness: Awake/alert Behavior During Therapy: WFL for tasks assessed/performed Overall Cognitive Status: Within Functional Limits for tasks assessed                      General Comments      Exercises  pt instructed in  UE AROM as she requests bc she states her arms have been very swollen; may benefit from HEP with resistance band   Assessment/Plan    PT Assessment Patient needs continued PT services  PT Problem List Decreased activity tolerance;Decreased mobility          PT Treatment Interventions DME instruction;Gait training;Stair training;Functional mobility training;Therapeutic exercise    PT Goals (Current goals can be found in the Care Plan section)  Acute Rehab PT Goals Patient Stated Goal: home soon, get stronger PT Goal Formulation: With  patient Time For Goal Achievement: 01/01/16 Potential to Achieve Goals: Good    Frequency Min 4X/week   Barriers to discharge   flight of stairs to bedroom    Co-evaluation               End of Session Equipment Utilized During Treatment: Gait belt Activity Tolerance: Patient tolerated treatment well Patient left: in chair;with call bell/phone within reach;with family/visitor present           Time: 1200-1231 PT Time Calculation (min) (ACUTE ONLY): 31 min   Charges:   PT Evaluation $PT Eval Moderate Complexity: 1 Procedure PT Treatments $Gait Training: 8-22 mins   PT G Codes:        Karan Ramnauth 08-Jan-2016, 1:28 PM

## 2015-12-25 NOTE — Progress Notes (Signed)
Patient ID: Laurie Molina, female   DOB: 1960/12/15, 55 y.o.   MRN: [id number] BP 108/69 (BP Location: Left Arm)   Pulse 87   Temp 97.9 F (36.6 C) (Oral)   Resp 18   Ht 5\' 5"  (1.651 m)   Wt 127.4 kg (280 lb 13.9 oz)   SpO2 94%   BMI 46.74 kg/m  Alert and oriented x 4. Still with headaches, but they have improved slightly. Moving better with pt today.  Will continue current measures.

## 2015-12-26 NOTE — Progress Notes (Signed)
Physical medicine rehabilitation consult requested chart reviewed. Patient progressing very nicely with latest therapy evaluation recommendations are for home health therapies. She lives with her spouse and has assistance. Hold on formal rehabilitation consult at this time anticipate discharge to home with home health therapies

## 2015-12-26 NOTE — Care Management Note (Signed)
Case Management Note  Patient Details  Name: Laurie Molina MRN: [id number] Date of Birth: 1960-04-18  Subjective/Objective:                    Action/Plan: Pt continues with HA and dizziness. Plan is for home when patient is medically ready. CM following for d/c needs.  Expected Discharge Date:                  Expected Discharge Plan:  Home/Self Care  In-House Referral:     Discharge planning Services     Post Acute Care Choice:    Choice offered to:     DME Arranged:    DME Agency:     HH Arranged:    HH Agency:     Status of Service:  In process, will continue to follow  If discussed at Long Length of Stay Meetings, dates discussed:    Additional Comments:  Pollie Friar, RN 12/26/2015, 10:25 AM

## 2015-12-26 NOTE — Progress Notes (Signed)
Patient ID: Laurie Molina, female   DOB: 01/25/1961, 55 y.o.   MRN: [id number] BP (!) 110/57 (BP Location: Left Arm)   Pulse 72   Temp 98.2 F (36.8 C) (Oral)   Resp 19   Ht 5\' 5"  (1.651 m)   Wt 127.4 kg (280 lb 13.9 oz)   SpO2 95%   BMI 46.74 kg/m  Alert and oriented x 4 Feeling better than yesterday Will discharge tomorrow Moving all extremities  Full eom

## 2015-12-26 NOTE — Progress Notes (Signed)
Physical Therapy Treatment Patient Details Name: Laurie Molina MRN: [id number] DOB: [birth year] Today's Date: 12/26/2015    History of Present Illness s/p CRANIOTOMY HYPOPHYSECTOMY TRANSNASAL APPROACH; PMHx: HTN, morbid obesity, PMR    PT Comments    Patient presents with symptoms suggestive of vestibular issue, but no peripheral or clear central signs noted in exam this session.  Feel it is reasonable to expect she will improve with continued skilled PT in the acute setting and follow up outpatient vestibular rehab for progressive habituation and some gaze stability activities as these too seem to exacerbate her symptoms.    Follow Up Recommendations  Outpatient PT (for vestibular rehab)     Equipment Recommendations  Rolling walker with 5" wheels    Recommendations for Other Services       Precautions / Restrictions Precautions Precautions: Fall    Mobility  Bed Mobility               General bed mobility comments: NT--pt in chair  Transfers Overall transfer level: Needs assistance Equipment used: Rolling walker (2 wheeled) Transfers: Sit to/from Stand Sit to Stand: Supervision         General transfer comment: stood twice due to initially with some symptoms (6-7/10 dizziness)  Improved second time and cues initially for hand placement  Ambulation/Gait Ambulation/Gait assistance: Min guard;Supervision Ambulation Distance (Feet): 110 Feet (x 2) Assistive device: Rolling walker (2 wheeled) Gait Pattern/deviations: Step-through pattern;Decreased stride length     General Gait Details: slow pace, cues for continued compensation for symptoms with eyes on stationary target   Stairs Stairs: Yes Stairs assistance: Min guard Stair Management: One rail Left;Step to pattern;Sideways Number of Stairs: 4 General stair comments: cues and demo for technique  Wheelchair Mobility    Modified Rankin (Stroke Patients Only)       Balance           Standing  balance support: No upper extremity supported Standing balance-Leahy Scale: Fair Standing balance comment: stands without UE support, but more confident with walker for ambulation                    Cognition Arousal/Alertness: Awake/alert Behavior During Therapy: WFL for tasks assessed/performed Overall Cognitive Status: Within Functional Limits for tasks assessed                      Exercises      General Comments  Vestibular Assessment    12/26/15 0001  Symptom Behavior  Type of Dizziness "Funny feeling in head"  Frequency of Dizziness intermittent with movement  Duration of Dizziness minutes  Aggravating Factors Supine to sit;Sit to stand  Relieving Factors Rest;Slow movements;Head stationary  Occulomotor Exam  Occulomotor Alignment Normal  Spontaneous Absent  Gaze-induced Absent  Head shaking Horizontal Absent  Head Shaking Vertical Absent  Smooth Pursuits Intact  Saccades Intact  Vestibulo-Occular Reflex  VOR 1 Head Only (x 1 viewing) intact 30 seconds horizontal and vertical movements  VOR to Slow Head Movement Negative right;Negative left  VOR Cancellation Normal  Auditory  Comments intact and equal bilaterally with scratch test  Positional Testing  Sidelying Test Sidelying Right;Sidelying Left  Sidelying Right  Sidelying Right Duration 30 sec  Sidelying Right Symptoms No nystagmus  Sidelying Left  Sidelying Left Duration 30 sec  Sidelying Left Symptoms No nystagmus        Pertinent Vitals/Pain Pain Assessment: 0-10 Pain Score: 2  Pain Location: head pressure Pain Intervention(s): Monitored during session  Home Living                      Prior Function            PT Goals (current goals can now be found in the care plan section) Progress towards PT goals: Progressing toward goals    Frequency    Min 4X/week      PT Plan Discharge plan needs to be updated    Co-evaluation             End of Session  Equipment Utilized During Treatment: Gait belt Activity Tolerance: Patient tolerated treatment well Patient left: in chair;with call bell/phone within reach     Time: 1336-1414 PT Time Calculation (min) (ACUTE ONLY): 38 min  Charges:  $Gait Training: 8-22 mins $Self Care/Home Management: 8-22 $Physical Performance Test: 8-22 mins                    G Codes:      Reginia Naas 01-13-2016, 4:35 PM  Magda Kiel, [place] Jan 13, 2016

## 2015-12-27 MED ORDER — SALINE SPRAY 0.65 % NA SOLN: 4.0000 | NASAL | 1 refills | Status: DC | PRN

## 2015-12-27 MED ORDER — PREDNISONE 20 MG PO TABS: 10.0000 mg | ORAL_TABLET | Freq: Every day | ORAL | 0 refills | Status: AC

## 2015-12-27 MED ORDER — OXYCODONE-ACETAMINOPHEN 5-325 MG PO TABS: 1.0000 | ORAL_TABLET | Freq: Four times a day (QID) | ORAL | 0 refills | Status: AC | PRN

## 2015-12-27 MED ORDER — PREDNISONE 20 MG PO TABS: 20.0000 mg | ORAL_TABLET | Freq: Every morning | ORAL | 0 refills | Status: AC

## 2015-12-27 NOTE — Discharge Summary (Signed)
Physician Discharge Summary  Patient ID: Laurie Molina MRN: [id number] DOB/AGE: 55/10/1960 55 y.o.  Admit date: 12/18/2015 Discharge date: 12/27/2015  Admission Diagnoses:pituitary mass  Discharge Diagnoses:  Active Problems:   Pituitary mass [facility])   Discharged Condition: good  Hospital Course: Laurie Molina was admitted and taken to the OR for a transsphenoidal pituitary tumor resection. Post op she has had headaches which have improved. She is on prednisone for polymyalgia rheumatica and will stay on a dose of 30mg  a day post op. Visual fields are normal, she has had some dizziness which will be dealt with with outpatient pt. She is ambulating, voiding, and tolerating a regular diet.  Treatments: surgery: as above.  Discharge Exam: Blood pressure 122/72, pulse 95, temperature 97.7 F (36.5 C), temperature source Oral, resp. rate 20, height 5\' 5"  (1.651 m), weight 127.4 kg (280 lb 13.9 oz), SpO2 96 %. General appearance: alert, cooperative, appears stated age and mild distress Neurologic: Alert and oriented X 3, normal strength and tone. Normal symmetric reflexes. Normal coordination and gait  Disposition: Final discharge disposition not confirmed PITUITARY [place] Discharge Instructions    Diet - low sodium heart healthy    Complete by:  As directed    Discharge instructions    Complete by:  As directed    1. Limited activity 2. Liquid and soft diet 3. May bathe and shower 4. Saline nasal spray - 4 puffs/nostril every hour while awake 5. Elevate Head of Bed 6. No nose blowing   Increase activity slowly    Complete by:  As directed    Walker rolling    Complete by:  As directed        Medication List    TAKE these medications   acetaminophen 500 MG tablet Commonly known as:  TYLENOL Take 1,000 mg by mouth 3 (three) times daily as needed (for pain.).   aspirin EC 81 MG tablet Take 81 mg by mouth daily.   CALCIUM 600 + D PO Take 2 tablets by mouth daily.    FLUoxetine 20 MG capsule Commonly known as:  PROZAC Take 20-40 mg by mouth as directed. Alternating between 20 mg and 40 mg every other day   losartan 100 MG tablet Commonly known as:  COZAAR Take 100 mg by mouth daily.   multivitamin tablet Take 1 tablet by mouth daily.   omeprazole 20 MG capsule Commonly known as:  PRILOSEC Take 20 mg by mouth daily.   oxyCODONE-acetaminophen 5-325 MG tablet Commonly known as:  PERCOCET/ROXICET Take 1-2 tablets by mouth every 6 (six) hours as needed for severe pain. What changed:  how much to take  when to take this   predniSONE 20 MG tablet Commonly known as:  DELTASONE Take 1 tablet (20 mg total) by mouth every morning. What changed:  when to take this   predniSONE 20 MG tablet Commonly known as:  DELTASONE Take 0.5 tablets (10 mg total) by mouth daily after supper. What changed:  You were already taking a medication with the same name, and this prescription was added. Make sure you understand how and when to take each.   sodium chloride 0.65 % Soln nasal spray Commonly known as:  OCEAN Place 4 sprays into both nostrils every hour as needed for congestion.      Follow-up Information    SHOEMAKER, DAVID, MD. Schedule an appointment as soon as possible for a visit in 2 week(s).   Specialty:  Otolaryngology Contact information: [address]  [place] [address] [phone]        Jacquline Terrill L, MD Follow up in 3 week(s).   Specialty:  Neurosurgery Why:  please call the office to make an appointment for 3 weeks.  Contact information: [address] [place] [address] [phone]           Signed: Winfield Cunas 12/27/2015, 10:18 AM

## 2015-12-27 NOTE — Progress Notes (Signed)
Patient is discharged from room 5C10 at this time. Alert and in stable condition. IV site d/c'd as well as tele. Instructions read to patient with understanding verbalized. Left unit via wheelchair with all belongings at side.

## 2015-12-27 NOTE — Care Management Note (Signed)
Case Management Note  Patient Details  Name: Laurie Molina MRN: [id number] Date of Birth: 1961-01-19  Subjective/Objective:                    Action/Plan: Pt discharging home with her significant other. She has orders for rolling walker. Jermaine with [facility] DME was notified and will deliver the equipment to the room. Pt also with recommendations for vestibular outpatient rehab. Dr Christella Noa notes it in his d/c summary and patient states they discussed it and that she thought PT was going to order it. Patient is interested in attending outpatient vestibular rehab at [facility]. Orders placed in EPIC and information on the AVS.   Expected Discharge Date:                  Expected Discharge Plan:  Home/Self Care  In-House Referral:     Discharge planning Services  CM Consult  Post Acute Care Choice:  Durable Medical Equipment Choice offered to:  Patient  DME Arranged:  Walker rolling DME Agency:  [place]:    [place]:     Status of Service:  Completed, signed off  If discussed at [place] of Stay Meetings, dates discussed:    Additional Comments:  Pollie Friar, RN 12/27/2015, 10:48 AM

## 2016-01-09 ENCOUNTER — Encounter: Payer: Self-pay | Admitting: Endocrinology

## 2016-01-09 ENCOUNTER — Ambulatory Visit (INDEPENDENT_AMBULATORY_CARE_PROVIDER_SITE_OTHER): Payer: PRIVATE HEALTH INSURANCE | Admitting: Endocrinology

## 2016-01-09 VITALS — BP 100/70 | HR 111 | Wt 264.0 lb

## 2016-01-09 DIAGNOSIS — E038 Other specified hypothyroidism: Secondary | ICD-10-CM

## 2016-01-09 DIAGNOSIS — R5383 Other fatigue: Secondary | ICD-10-CM | POA: Diagnosis not present

## 2016-01-09 DIAGNOSIS — D497 Neoplasm of unspecified behavior of endocrine glands and other parts of nervous system: Secondary | ICD-10-CM | POA: Diagnosis not present

## 2016-01-09 NOTE — Progress Notes (Signed)
Patient ID: Laurie Molina, female   DOB: 04/09/1960, 55 y.o.   MRN: [id number]            Referring physician: Ellene Route  Chief complaint: Recent pituitary tumor  History of Present Illness:   She started having headaches June or July and these were progressive.  Although she was initially thought to have temporal arteritis this was shown to be not the case. She also started developing visual difficulties and blurred vision and her ophthalmologist found that she had visual field defect. For this she had CT and MRI done which showed a large mixed cystic and solid mass pituitary mass which extended into the left cavernous sinus  The mass measures 27 mm in height and 22 x 27 mm and transverse dimension. The mass is compressing the optic chiasm and right optic nerve similar to the MRI. She had baseline labs done on 12/16/15 prior to her surgery on 10/11. These showed a low free T4 level of 0.52, prolactin of 36 and FSH of 3.6.  Electrolytes normal on admission.  More recently after her surgery she has had somewhat decreased appetite She also feels more tired and weak Currently she is on prednisone which has been continued for treatment of her PMR and this has been tapered down slowly for the last couple of months.  She was taking 30 mg after surgery and this was reduced after a week down to 20 mg.  She is taking 10 mg twice a day  Since her surgery she has not had an increased frequency of urination or thirst. She does complain of tiredness weakness above She has no prior history of enlargement of her hands and feet, excessive bruising or unusual weight gain prior to starting prednisone    Past Medical History:  Diagnosis Date  . Depression   . DVT (deep venous thrombosis) ([place]) 2006  . Early cataract    left  . GERD (gastroesophageal reflux disease)   . H/O major depression 1988  . Hypertension   . Morbidly obese ([place])   . OSA on CPAP   . Osteoarthritis   . Pituitary mass ([place])   . PMR  (polymyalgia rheumatica) ([facility])   . PONV (postoperative nausea and vomiting)   . Skin cancer   . Spinal headache    from spinal tap had blood patch  . Viral meningitis 1997    Past Surgical History:  Procedure Laterality Date  . BUNIONECTOMY  2007  . COLONOSCOPY    . CRANIOTOMY N/A 12/18/2015   Procedure: CRANIOTOMY HYPOPHYSECTOMY TRANSNASAL APPROACH;  Surgeon: Ashok Pall, MD;  Location: [facility];  Service: Neurosurgery;  Laterality: N/A;  . LAPAROSCOPIC CHOLECYSTECTOMY  2003  . MYOMECTOMY  2007  . SINUS ENDO W/FUSION N/A 12/18/2015   Procedure: ENDOSCOPIC SINUS SURGERY WITH NAVIGATION;  Surgeon: Jerrell Belfast, MD;  Location: [facility] OR;  Service: ENT;  Laterality: N/A;    Family History  Problem Relation Age of Onset  . Hypertension Mother   . Osteoporosis Mother   . Dementia Paternal Grandmother   . Diabetes Paternal Grandmother   . Aortic aneurysm Paternal Grandfather   . Diabetes Paternal Grandfather   . Heart attack Maternal Grandfather   . Diabetes Maternal Grandfather   . Diabetes Maternal Grandmother   . Thyroid disease Maternal Grandmother     Social History:  reports that she has never smoked. She has never used smokeless tobacco. She reports that she drinks alcohol. She reports that she does not use drugs.  Allergies:  Allergies  Allergen Reactions  . No Known Allergies       Medication List       Accurate as of 01/09/16  4:38 PM. Always use your most recent med list.          acetaminophen 500 MG tablet Commonly known as:  TYLENOL Take 1,000 mg by mouth 3 (three) times daily as needed (for pain.).   aspirin EC 81 MG tablet Take 81 mg by mouth daily.   CALCIUM 600 + D PO Take 2 tablets by mouth daily.   FLUoxetine 20 MG capsule Commonly known as:  PROZAC Take 20 mg by mouth daily. Alternating between 20 mg and 40 mg every other day   losartan 100 MG tablet Commonly known as:  COZAAR Take 100 mg by mouth daily.   multivitamin tablet Take 1  tablet by mouth daily.   omeprazole 20 MG capsule Commonly known as:  PRILOSEC Take 20 mg by mouth daily.   oxyCODONE-acetaminophen 5-325 MG tablet Commonly known as:  PERCOCET/ROXICET Take 1-2 tablets by mouth every 6 (six) hours as needed for severe pain.   predniSONE 20 MG tablet Commonly known as:  DELTASONE Take 1 tablet (20 mg total) by mouth every morning.   predniSONE 20 MG tablet Commonly known as:  DELTASONE Take 0.5 tablets (10 mg total) by mouth daily after supper.       LABS:  No visits with results within 1 Week(s) from this visit.  Latest known visit with results is:  Admission on 12/18/2015, Discharged on 12/27/2015  Component Date Value Ref Range Status  . WBC 12/18/2015 6.2  4.0 - 10.5 K/uL Final  . RBC 12/18/2015 4.38  3.87 - 5.11 MIL/uL Final  . Hemoglobin 12/18/2015 13.5  12.0 - 15.0 g/dL Final  . HCT 12/18/2015 41.0  36.0 - 46.0 % Final  . MCV 12/18/2015 93.6  78.0 - 100.0 fL Final  . MCH 12/18/2015 30.8  26.0 - 34.0 pg Final  . MCHC 12/18/2015 32.9  30.0 - 36.0 g/dL Final  . RDW 12/18/2015 14.4  11.5 - 15.5 % Final  . Platelets 12/18/2015 153  150 - 400 K/uL Final  . Sodium 12/18/2015 141  135 - 145 mmol/L Final  . Potassium 12/18/2015 4.2  3.5 - 5.1 mmol/L Final  . Chloride 12/18/2015 104  101 - 111 mmol/L Final  . CO2 12/18/2015 27  22 - 32 mmol/L Final  . Glucose, Bld 12/18/2015 148* 65 - 99 mg/dL Final  . BUN 12/18/2015 25* 6 - 20 mg/dL Final  . Creatinine, Ser 12/18/2015 0.76  0.44 - 1.00 mg/dL Final  . Calcium 12/18/2015 9.3  8.9 - 10.3 mg/dL Final  . GFR calc non Af Amer 12/18/2015 >60  >60 mL/min Final  . GFR calc Af Amer 12/18/2015 >60  >60 mL/min Final   Comment: (NOTE) The eGFR has been calculated using the CKD EPI equation. This calculation has not been validated in all clinical situations. eGFR's persistently <60 mL/min signify possible Chronic Kidney Disease.   . Anion gap 12/18/2015 10  5 - 15 Final  . Prothrombin Time  12/18/2015 11.9  11.4 - 15.2 seconds Final  . INR 12/18/2015 0.88   Final  . ABO/RH(D) 12/18/2015 A POS   Final  . Antibody Screen 12/18/2015 NEG   Final  . Sample Expiration 12/18/2015 12/21/2015   Final  . ABO/RH(D) 12/18/2015 A POS   Final  . Sodium 12/20/2015 136  135 - 145 mmol/L Final  .  Potassium 12/20/2015 3.7  3.5 - 5.1 mmol/L Final  . Chloride 12/20/2015 100* 101 - 111 mmol/L Final  . CO2 12/20/2015 30  22 - 32 mmol/L Final  . Glucose, Bld 12/20/2015 105* 65 - 99 mg/dL Final  . BUN 12/20/2015 10  6 - 20 mg/dL Final  . Creatinine, Ser 12/20/2015 0.75  0.44 - 1.00 mg/dL Final  . Calcium 12/20/2015 8.6* 8.9 - 10.3 mg/dL Final  . GFR calc non Af Amer 12/20/2015 >60  >60 mL/min Final  . GFR calc Af Amer 12/20/2015 >60  >60 mL/min Final   Comment: (NOTE) The eGFR has been calculated using the CKD EPI equation. This calculation has not been validated in all clinical situations. eGFR's persistently <60 mL/min signify possible Chronic Kidney Disease.   . Anion gap 12/20/2015 6  5 - 15 Final        Review of Systems  Constitutional: Positive for weight loss.  HENT: Positive for headaches. Negative for nasal congestion.        Better now  Eyes:       Had significant blurring of vision prior to pituitary surgery and this is much better now including peripheral vision  Cardiovascular: Negative for leg swelling.  Gastrointestinal: Negative for nausea and constipation.  Endocrine: Negative for cold intolerance and polydipsia.       Continues to have hot flashes with some sweating.  No cold intolerance.  Only has coldness of her feet.  Genitourinary: Positive for frequency. Negative for nocturia.  Musculoskeletal: Positive for joint pain.       She has history of PMR presenting as pain in her shoulders and thighs and weakness in her muscles, followed by rheumatologist, no records available  Skin: Positive for dry skin.  Neurological: Positive for weakness.     PHYSICAL  EXAM:  BP 100/70   Pulse (!) 111   Wt 264 lb (119.7 kg)   SpO2 94%   BMI 43.93 kg/m   GENERAL:  Generalized obesity present. She has supraclavicular fat pads and [place] pump.   No pallor, clubbing, lymphadenopathy or edema.   Skin:  no rash or pigmentation.  Has ecchymoses from venipuncture  EYES:  Externally normal.  Visual fields normal by confrontation Fundii: Difficult to visualize  discs and vessels.  ENT: Oral mucosa and tongue normal.  THYROID:  Not palpable.  HEART:  Normal  S1 and S2; no murmur or click.  CHEST:  Normal shape.  Lungs: Vescicular breath sounds heard equally.  No crepitations/ wheeze.  ABDOMEN:  No distention.  Liver and spleen not palpable.  No other mass or tenderness.  NEUROLOGICAL: .Reflexes are bilaterally normal at biceps and at ankles.  JOINTS:  Normal.   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  Clinically has no signs or symptoms of pituitary hormone excess and it is likely to be a nonfunctioning tumor  Has mild increase in prolactin likely to be from stalk compression.  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  Baseline free T4 level was low  Any cortisol deficiency is marked by her taking large doses of prednisone recently  She is menopausal and emphasized using inappropriately low at 3.6  IGF-I level has not been assessed  Iatrogenic Cushing's disease: This may be causing some of her muscle weakness   PLAN:    CheckFree T4, prolactin and IGF-I levels.  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  Would also consider HRT if she continues to have significant heart flashes.  She does not need corticosteroid supplementation since she will be on prednisone for some time now  Discussed hypopituitarism, supplementation of various  hormones and need to assess pituitary function periodically to see if it recovers after surgery  Consultation note sent to the referring physician  [facility] 01/09/2016, 4:38 PM

## 2016-01-10 LAB — T4, FREE: FREE T4: 0.92 ng/dL (ref 0.60–1.60)

## 2016-01-10 LAB — INSULIN-LIKE GROWTH FACTOR: Insulin-Like GF-1: 118 ng/mL (ref 53–190)

## 2016-01-10 LAB — PROLACTIN: PROLACTIN: 45.4 ng/mL — AB (ref 4.8–23.3)

## 2016-01-10 NOTE — Progress Notes (Signed)
Please let patient know that the thyroid and growth hormone lab result is normal indicating recovery and no treatment needed Prolactin test still slightly high but will wait until after the MRI to consider treatment She will need to talk to her rheumatologist and PCP about her current symptoms

## 2016-02-10 ENCOUNTER — Telehealth: Payer: Self-pay | Admitting: Endocrinology

## 2016-02-10 NOTE — Telephone Encounter (Signed)
Pt called and said that she recalls receiving a message after her last lab work was resulted and that she thinks she remembered someone telling her to come back in January versus in December.  She wants to verify that this is correct and if she needs to come in this week or in January. Please advise, her office number is [phone] and you may leave a message.

## 2016-02-11 NOTE — Telephone Encounter (Signed)
We can see her when she is down to 7.5 mg prednisone

## 2016-02-11 NOTE — Telephone Encounter (Signed)
Gave patient instructions and she stated an understanding- she is rescheduling her appointment until she is down to 7.5 mg

## 2016-02-11 NOTE — Telephone Encounter (Signed)
Pt is asking if she needs to be seen this week please advise  She has seen her rheumatologist and is being weaned off steriod

## 2016-02-13 ENCOUNTER — Ambulatory Visit: Payer: PRIVATE HEALTH INSURANCE | Admitting: Endocrinology

## 2017-09-02 IMAGING — CT CT HEAD WO/W CM
4 of 5 series · 17 of 47 positions shown, 19 images · IV contrast (Iodine)
Comparison: MRI head 12/11/2015

CLINICAL DATA: Severe headache. Pituitary lesion. Rule out
cavernous sinus thrombosis or hemorrhage.

EXAM:
CT HEAD WITHOUT AND WITH CONTRAST
TECHNIQUE: Contiguous axial images were obtained from the base of the skull
through the vertex without and with intravenous contrast
CONTRAST:  75mL [id number] IOPAMIDOL ([id number]) INJECTION
61%<Contrast>75mL [id number] IOPAMIDOL ([id number]) INJECTION 61%

[Series 201: head w/o, idose (1) · axial · non-contrast · 0.49mm/px · z∈[+164,+264]mm · 5 of 32 slices shown]
[im 6/32  brain]
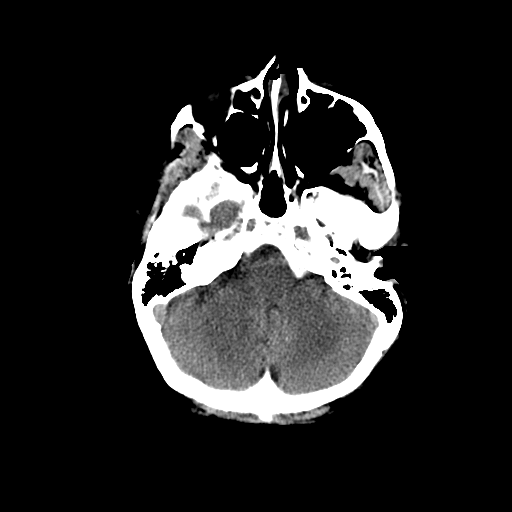
[im 11/32  brain]
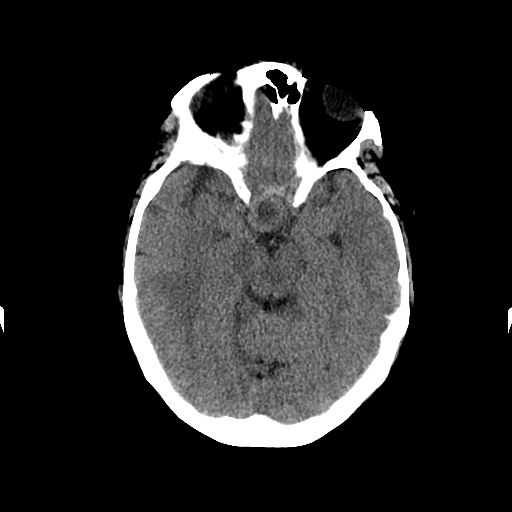
[im 16/32  brain]
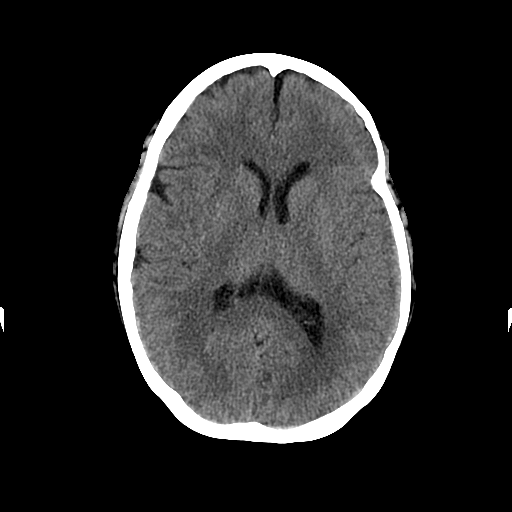
[im 21/32  brain]
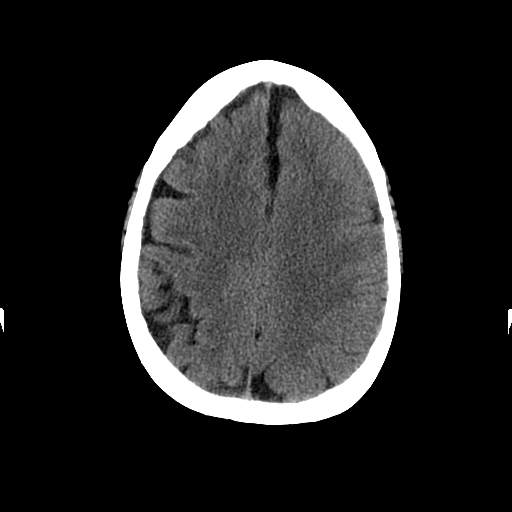
[im 26/32  brain]
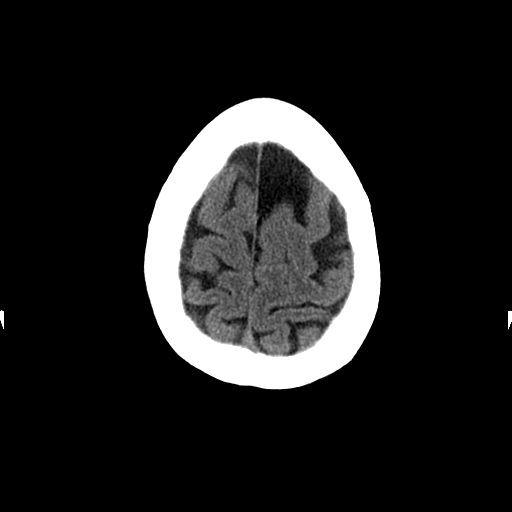

[Series 301: head with , idose (1) · axial · 0.46mm/px · z∈[+159,+269]mm · 6 of 32 slices shown, 8 images]
[im 5/32  brain]
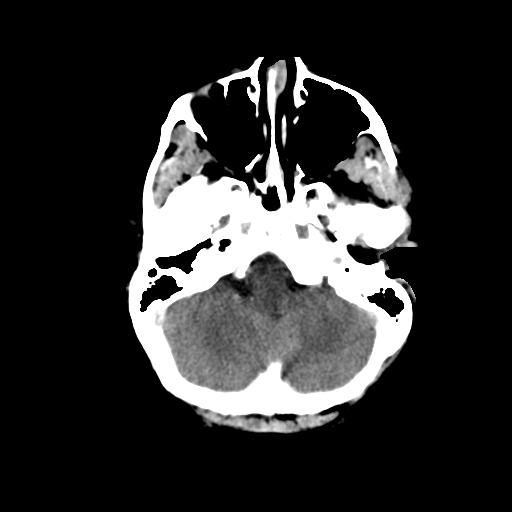
[im 5/32  bone]
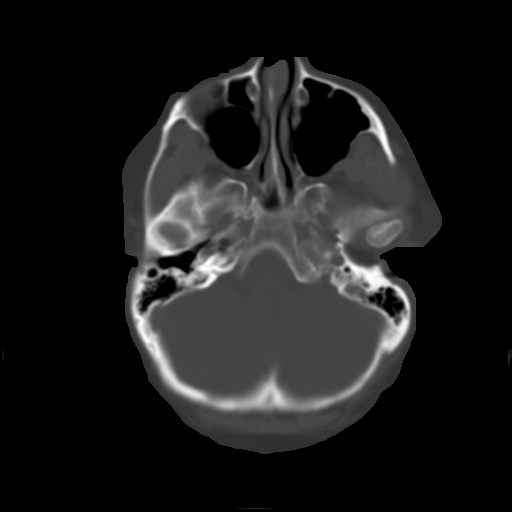
[im 9/32  brain]
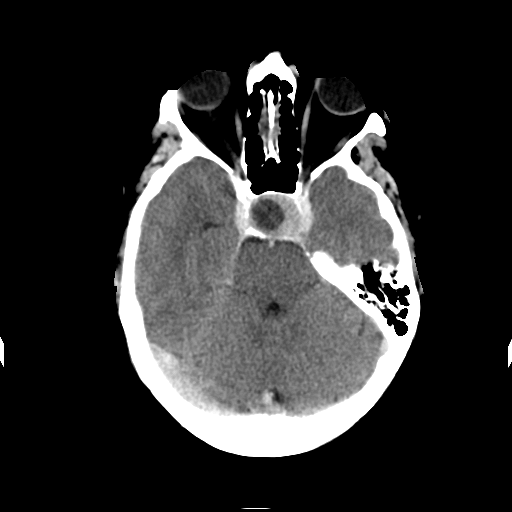
[im 14/32  brain]
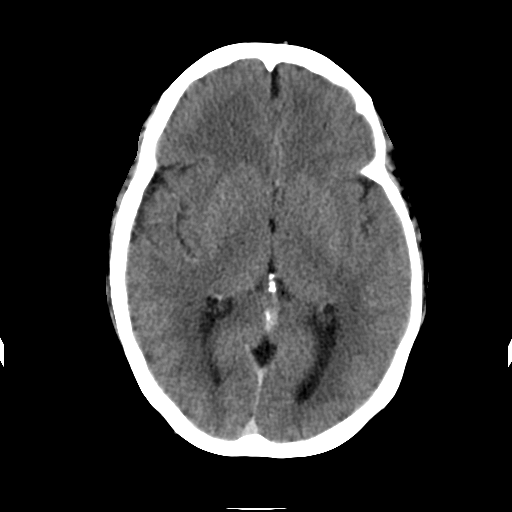
[im 18/32  brain]
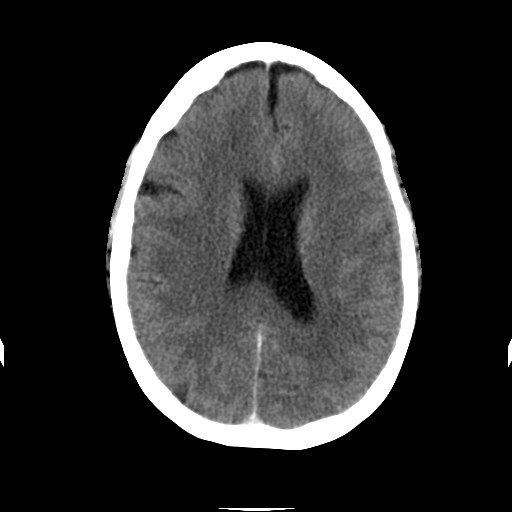
[im 23/32  brain]
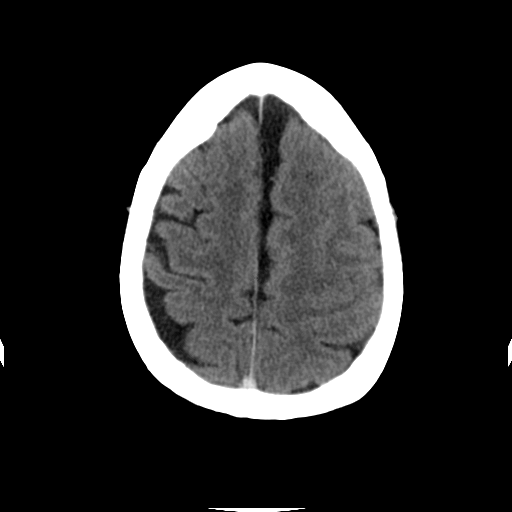
[im 23/32  bone]
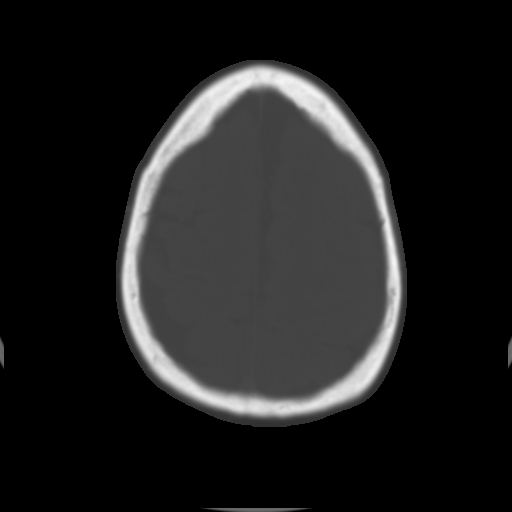
[im 27/32  brain]
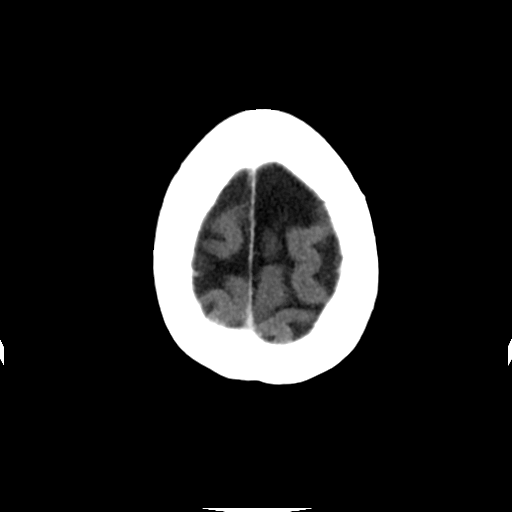

[Series 303: sagittal st, idose (1) · sagittal · 0.40mm/px · 3 of 77 slices shown]
[im 26/77  brain]
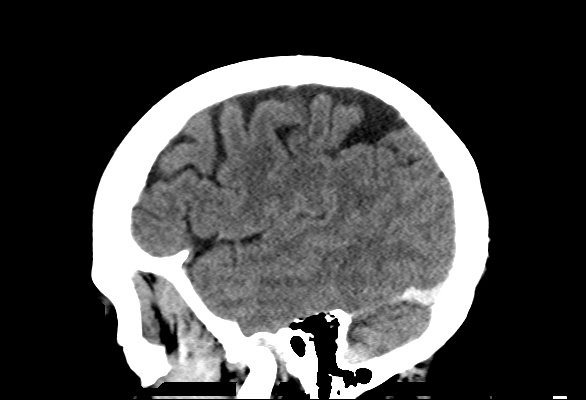
[im 39/77  brain]
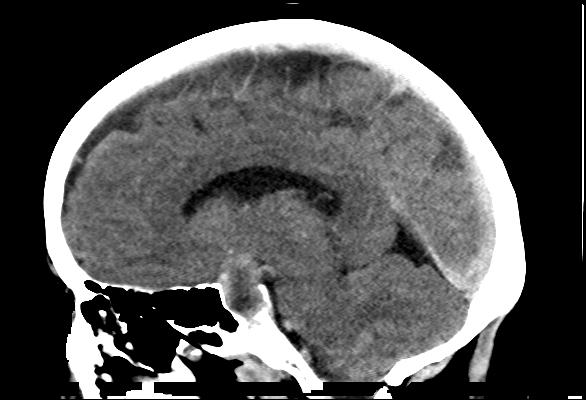
[im 51/77  brain]
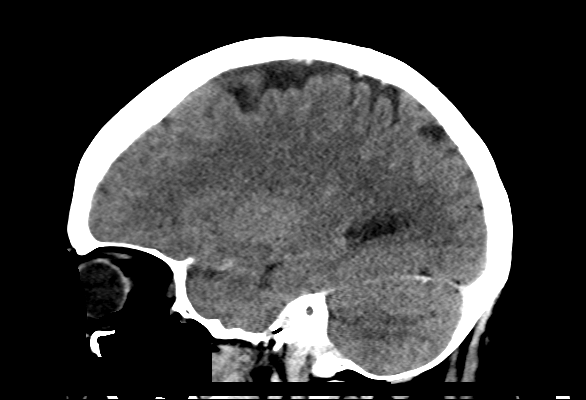

[Series 304: coronal st, idose (1) · coronal · 0.40mm/px · 3 of 76 slices shown]
[im 26/76  brain]
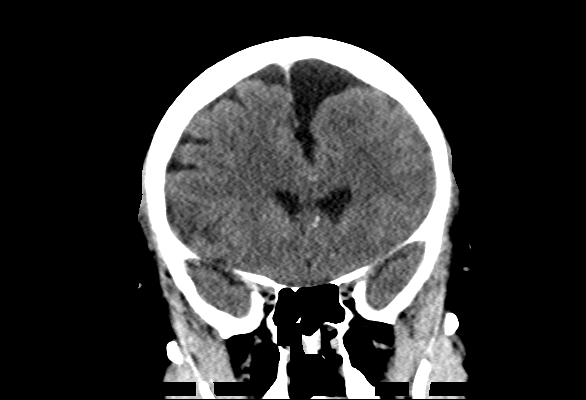
[im 34/76  brain]
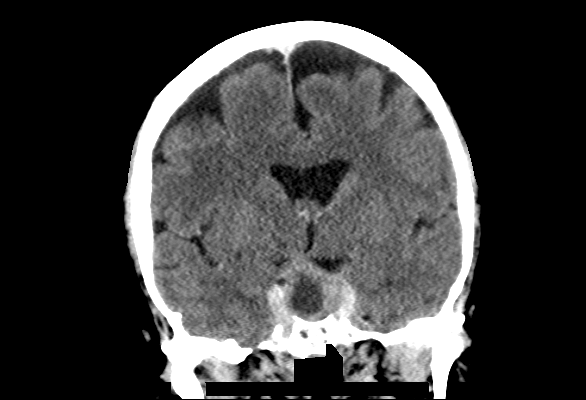
[im 42/76  brain]
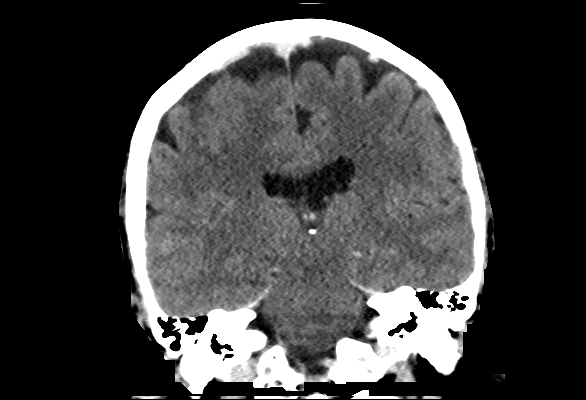

[17 of 47 positions shown; findings below may reference images not displayed]

FINDINGS: Brain: Mixed cystic and solid mass arising from the sella is similar
to the prior CT. There is a peripheral rim of enhancement with
central well-defined cystic component centrally. The sella is
enlarged. No evidence of acute hemorrhage. The enhancing mass
extends into the left cavernous sinus unchanged from the prior
study. No evidence of cavernous sinus thrombosis. The mass measures
27 mm in height and 22 x 27 mm and transverse dimension. No
calcification in the mass. The mass is compressing the optic chiasm
and right optic nerve similar to the MRI.

Ventricle size is normal. No shift of the midline structures.
Negative for acute infarct. No intracranial hemorrhage. Extra-axial
CSF collection over the left frontal lobe is unchanged from the
prior study and may be due to a hygroma. Mild prominence of extra
axial CSF in the right parietal lobe also unchanged may represent a
hygroma.

Vascular: Normal arterial and venous enhancement.

Skull: Enlargement of the sella.  No other skull lesion.

Sinuses/Orbits: Negative

Other: None
IMPRESSION: Cystic and solid mass arising from the sella most compatible with
pituitary macro adenoma. No calcification of the mass to suggest
craniopharyngioma. No evidence of acute hemorrhage in the mass. No
evidence of cavernous sinus thrombosis. The mass is compressing the
optic chiasm and right optic nerve similar to the prior MRI.

## 2018-10-22 ENCOUNTER — Other Ambulatory Visit: Payer: Self-pay

## 2018-10-22 ENCOUNTER — Emergency Department (HOSPITAL_COMMUNITY): Payer: Commercial Managed Care - PPO

## 2018-10-22 ENCOUNTER — Encounter (HOSPITAL_COMMUNITY): Payer: Self-pay

## 2018-10-22 ENCOUNTER — Emergency Department (HOSPITAL_COMMUNITY)
Admission: EM | Admit: 2018-10-22 | Discharge: 2018-10-22 | Disposition: A | Discharge status: Home / Self Care | Payer: Commercial Managed Care - PPO | Attending: Emergency Medicine | Admitting: Emergency Medicine

## 2018-10-22 DIAGNOSIS — Z79899 Other long term (current) drug therapy: Secondary | ICD-10-CM | POA: Insufficient documentation

## 2018-10-22 DIAGNOSIS — U071 COVID-19: Secondary | ICD-10-CM

## 2018-10-22 DIAGNOSIS — I10 Essential (primary) hypertension: Secondary | ICD-10-CM | POA: Insufficient documentation

## 2018-10-22 DIAGNOSIS — M791 Myalgia, unspecified site: Secondary | ICD-10-CM | POA: Diagnosis present

## 2018-10-22 DIAGNOSIS — Z7982 Long term (current) use of aspirin: Secondary | ICD-10-CM | POA: Diagnosis not present

## 2018-10-22 LAB — CBC WITH DIFFERENTIAL/PLATELET
Abs Immature Granulocytes: 0.04 10*3/uL (ref 0.00–0.07)
Basophils Absolute: 0 10*3/uL (ref 0.0–0.1)
Basophils Relative: 0 %
Eosinophils Absolute: 0.1 10*3/uL (ref 0.0–0.5)
Eosinophils Relative: 3 %
HCT: 38.3 % (ref 36.0–46.0)
Hemoglobin: 12.2 g/dL (ref 12.0–15.0)
Immature Granulocytes: 1 %
Lymphocytes Relative: 13 %
Lymphs Abs: 0.6 10*3/uL — ABNORMAL LOW (ref 0.7–4.0)
MCH: 29.2 pg (ref 26.0–34.0)
MCHC: 31.9 g/dL (ref 30.0–36.0)
MCV: 91.6 fL (ref 80.0–100.0)
Monocytes Absolute: 0.9 10*3/uL (ref 0.1–1.0)
Monocytes Relative: 18 %
Neutro Abs: 3 10*3/uL (ref 1.7–7.7)
Neutrophils Relative %: 65 %
Platelets: 175 10*3/uL (ref 150–400)
RBC: 4.18 MIL/uL (ref 3.87–5.11)
RDW: 14.3 % (ref 11.5–15.5)
WBC: 4.6 10*3/uL (ref 4.0–10.5)
nRBC: 0 % (ref 0.0–0.2)

## 2018-10-22 LAB — COMPREHENSIVE METABOLIC PANEL
ALT: 63 U/L — ABNORMAL HIGH (ref 0–44)
AST: 39 U/L (ref 15–41)
Albumin: 3.8 g/dL (ref 3.5–5.0)
Alkaline Phosphatase: 70 U/L (ref 38–126)
Anion gap: 9 (ref 5–15)
BUN: 9 mg/dL (ref 6–20)
CO2: 24 mmol/L (ref 22–32)
Calcium: 8.9 mg/dL (ref 8.9–10.3)
Chloride: 105 mmol/L (ref 98–111)
Creatinine, Ser: 0.79 mg/dL (ref 0.44–1.00)
GFR calc Af Amer: >60 mL/min (ref >60)
GFR calc non Af Amer: >60 mL/min (ref >60)
Glucose, Bld: 106 mg/dL — ABNORMAL HIGH (ref 70–99)
Potassium: 3.7 mmol/L (ref 3.5–5.1)
Sodium: 138 mmol/L (ref 135–145)
Total Bilirubin: 0.3 mg/dL (ref 0.3–1.2)
Total Protein: 6.7 g/dL (ref 6.5–8.1)

## 2018-10-22 LAB — SARS CORONAVIRUS 2 BY RT PCR (HOSPITAL ORDER, PERFORMED IN ~~LOC~~ HOSPITAL LAB): SARS Coronavirus 2: POSITIVE — AB

## 2018-10-22 NOTE — ED Triage Notes (Signed)
Se c/o uri sx plus "not feeling well" since yesterday afternoon. T max at home: 101. She tells me she has not taken any antipyretic(s) today. She is in no distress. Here skin is normal, warm and dry.

## 2018-10-22 NOTE — ED Provider Notes (Signed)
[facility] DEPT Provider Note   CSN: [id number] Arrival date & time: 10/22/18  1032     History   Chief Complaint No chief complaint on file.   HPI Laurie Molina is a 58 y.o. female.     Patient with history of DVT, polymyalgia rheumatica on Methotrexate, HTN, GERD, OSA presents with 2 days of generalized myalgias, cough, sore throat, headache, fever with Tmax at home of 101.2, DOE. 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 No nausea, vomiting. She reports 2-3 episodes diarrhea this morning.   The history is provided by the patient. No language interpreter was used.    Past Medical History:  Diagnosis Date  . Depression   . DVT (deep venous thrombosis) ([place]) 2006  . Early cataract    left  . GERD (gastroesophageal reflux disease)   . H/O major depression 1988  . Hypertension   . Morbidly obese ([place])   . OSA on CPAP   . Osteoarthritis   . Pituitary mass ([place])   . PMR (polymyalgia rheumatica) ([facility])   . PONV (postoperative nausea and vomiting)   . Skin cancer   . Spinal headache    from spinal tap had blood patch  . Viral meningitis 1997    Patient Active Problem List   Diagnosis Date Noted  . Pituitary mass ([place]) 12/18/2015  . Gait disorder 07/22/2015    Past Surgical History:  Procedure Laterality Date  . BUNIONECTOMY  2007  . COLONOSCOPY    . CRANIOTOMY N/A 12/18/2015   Procedure: CRANIOTOMY HYPOPHYSECTOMY TRANSNASAL APPROACH;  Surgeon: Ashok Pall, MD;  Location: [facility];  Service: Neurosurgery;  Laterality: N/A;  . LAPAROSCOPIC CHOLECYSTECTOMY  2003  . MYOMECTOMY  2007  . SINUS ENDO W/FUSION N/A 12/18/2015   Procedure: ENDOSCOPIC SINUS SURGERY WITH NAVIGATION;  Surgeon: Jerrell Belfast, MD;  Location: [facility];  Service: ENT;  Laterality: N/A;     OB History   No obstetric history on file.      Home Medications    Prior to Admission medications   Medication Sig Start Date End Date  Taking? Authorizing Provider  acetaminophen (TYLENOL) 500 MG tablet Take 1,000 mg by mouth 3 (three) times daily as needed (for pain.).    [provider]  aspirin EC 81 MG tablet Take 81 mg by mouth daily.    [provider]  Calcium Carb-Cholecalciferol (CALCIUM 600 + D PO) Take 2 tablets by mouth daily.    [provider]  FLUoxetine (PROZAC) 20 MG capsule Take 20 mg by mouth daily. Alternating between 20 mg and 40 mg every other day     [provider]  losartan (COZAAR) 100 MG tablet Take 100 mg by mouth daily.    [provider]  Multiple Vitamin (MULTIVITAMIN) tablet Take 1 tablet by mouth daily.    [provider]  omeprazole (PRILOSEC) 20 MG capsule Take 20 mg by mouth daily.    [provider]  oxyCODONE-acetaminophen (PERCOCET/ROXICET) 5-325 MG tablet Take 1-2 tablets by mouth every 6 (six) hours as needed for severe pain. 12/27/15   Ashok Pall, MD  predniSONE (DELTASONE) 20 MG tablet Take 1 tablet (20 mg total) by mouth every morning. 12/27/15   Ashok Pall, MD  predniSONE (DELTASONE) 20 MG tablet Take 0.5 tablets (10 mg total) by mouth daily after supper. 12/27/15   Ashok Pall, MD    Family History Family History  Problem Relation Age of Onset  .  Hypertension Mother   . Osteoporosis Mother   . Dementia Paternal Grandmother   . Diabetes Paternal Grandmother   . Aortic aneurysm Paternal Grandfather   . Diabetes Paternal Grandfather   . Heart attack Maternal Grandfather   . Diabetes Maternal Grandfather   . Diabetes Maternal Grandmother   . Thyroid disease Maternal Grandmother     Social History Social History   Tobacco Use  . Smoking status: Never Smoker  . Smokeless tobacco: Never Used  Substance Use Topics  . Alcohol use: Yes    Alcohol/week: 0.0 standard drinks    Comment: 2-3 per wk  . Drug use: No     Allergies   No known allergies   Review of Systems Review of Systems   Constitutional: Positive for chills and fever.  HENT: Positive for postnasal drip and sore throat.   Respiratory: Positive for cough and shortness of breath (DOE - mild).   Cardiovascular: Negative.   Gastrointestinal: Positive for diarrhea. Negative for abdominal pain, nausea and vomiting.  Genitourinary: Negative.   Musculoskeletal: Positive for myalgias.  Skin: Negative.   Neurological: Positive for headaches.     Physical Exam Updated Vital Signs There were no vitals taken for this visit.  Physical Exam Nursing note reviewed.  Constitutional:      Appearance: She is well-developed. She is obese. She is not toxic-appearing.  HENT:     Head: Normocephalic.     Mouth/Throat:     Mouth: Mucous membranes are moist.     Pharynx: No oropharyngeal exudate or posterior oropharyngeal erythema.  Eyes:     Conjunctiva/sclera: Conjunctivae normal.  Neck:     Musculoskeletal: Normal range of motion and neck supple.  Cardiovascular:     Rate and Rhythm: Normal rate and regular rhythm.     Heart sounds: No murmur.  Pulmonary:     Effort: Pulmonary effort is normal.     Breath sounds: Normal breath sounds. No wheezing, rhonchi or rales.  Abdominal:     General: Bowel sounds are normal.     Palpations: Abdomen is soft.     Tenderness: There is no abdominal tenderness. There is no guarding or rebound.  Musculoskeletal: Normal range of motion.  Skin:    General: Skin is warm and dry.     Findings: No rash.  Neurological:     Mental Status: She is alert and oriented to person, place, and time.      ED Treatments / Results  Labs (all labs ordered are listed, but only abnormal results are displayed) Labs Reviewed - No data to display  EKG None  Radiology No results found.  Procedures Procedures (including critical care time)  Medications Ordered in ED Medications - No data to display   Initial Impression / Assessment and Plan / ED Course  I have reviewed the triage  vital signs and the nursing notes.  Pertinent labs & imaging results that were available during my care of the patient were reviewed by me and considered in my medical decision making (see chart for details).        Patient to ED with ss/sxs URI with fever.   She works in hospital setting, wears appropriate PPE. She is on methotrexate for polymyalgia rheumatica. 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   VS reported as stable. No hypoxia, tachycardia or fever. Patient denies taking antipyretics at home prior to arrival, after getting a temp of 101.2. Afebrile here.   Feel  she is a candidate for discharge home given concern for COVID. 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   The patient remains stable, condition unchanged during ED encounter. COVID is positive. No PNA on CXR, labs unremarkable otherwise. VSS.  Discussed home care as a COVID positive patient. Discussed strict return precautions. The patient is stable for discharge home.   Final Clinical Impressions(s) / ED Diagnoses   Final diagnoses:  None   1. COVID-19  ED Discharge Orders    None       Charlann Lange, Hershal Coria 10/22/18 1512    Isla Pence, MD 10/23/18 [phone]

## 2018-10-22 NOTE — Discharge Instructions (Signed)
Push fluids. Take Tylenol for aches and fever. Avoid ibuprofen which is contraindicated during a COVID infection.   If you develop shortness of breath, significant chest pain, uncontrolled vomiting, copious diarrhea or new concern, please seek medical care.

## 2022-01-30 ENCOUNTER — Other Ambulatory Visit (HOSPITAL_BASED_OUTPATIENT_CLINIC_OR_DEPARTMENT_OTHER): Payer: Self-pay

## 2022-01-30 MED ORDER — COMIRNATY 30 MCG/0.3ML IM SUSY
PREFILLED_SYRINGE | INTRAMUSCULAR | 0 refills | Status: AC
  Filled 2022-01-30: qty 0.3, 1d supply, fill #0

## 2022-01-30 MED ORDER — RSVPREF3 VAC RECOMB ADJUVANTED 120 MCG/0.5ML IM SUSR
INTRAMUSCULAR | 0 refills | Status: AC
  Filled 2022-01-30 (×2): qty 1, 1d supply, fill #0
